# Patient Record
Sex: Male | Born: 1960 | Hispanic: No | Marital: Married | Smoking: Never smoker
Health system: Southern US, Community
[De-identification: ages and names within clinical notes are randomized; demographics above are authoritative.]

## PROBLEM LIST (undated history)

## (undated) DIAGNOSIS — I10 Essential (primary) hypertension: Secondary | ICD-10-CM

---

## 2003-12-21 ENCOUNTER — Inpatient Hospital Stay (HOSPITAL_COMMUNITY): Admission: EM | Admit: 2003-12-21 | Discharge: 2003-12-22 | Payer: Self-pay | Admitting: *Deleted

## 2003-12-21 ENCOUNTER — Ambulatory Visit: Payer: Self-pay | Admitting: Gastroenterology

## 2003-12-21 ENCOUNTER — Ambulatory Visit: Payer: Self-pay | Admitting: Family Medicine

## 2008-11-29 ENCOUNTER — Encounter (INDEPENDENT_AMBULATORY_CARE_PROVIDER_SITE_OTHER): Payer: Self-pay | Admitting: *Deleted

## 2009-08-21 ENCOUNTER — Telehealth: Payer: Self-pay | Admitting: Gastroenterology

## 2010-03-05 NOTE — Progress Notes (Signed)
Summary: Schedule Colonoscopy  Phone Note Outgoing Call Call back at [facility] Phone [phone]   Call placed by: Harlow Mares CMA Duncan Dull),  August 21, 2009 9:36 AM Call placed to: Patient Summary of Call: Left message on patients machine to call back. patient is due for a colonoscopy Initial call taken by: Harlow Mares CMA Duncan Dull),  August 21, 2009 9:36 AM  Follow-up for Phone Call        Left a message on the patient machine to call back and schedule a previsit and procedure with our office. A letter will be mailed to the patient.   Follow-up by: Harlow Mares CMA Duncan Dull),  August 28, 2009 12:11 PM

## 2011-06-18 ENCOUNTER — Encounter: Payer: Self-pay | Admitting: Gastroenterology

## 2019-07-10 ENCOUNTER — Emergency Department (HOSPITAL_COMMUNITY): Payer: 59

## 2019-07-10 ENCOUNTER — Inpatient Hospital Stay (HOSPITAL_COMMUNITY): Payer: 59

## 2019-07-10 ENCOUNTER — Inpatient Hospital Stay (HOSPITAL_COMMUNITY)
Admission: EM | Admit: 2019-07-10 | Discharge: 2019-07-21 | DRG: 064 | Disposition: A | Discharge status: Skilled Nursing Facility | Payer: 59 | Attending: Internal Medicine | Admitting: Internal Medicine

## 2019-07-10 ENCOUNTER — Encounter (HOSPITAL_COMMUNITY): Payer: Self-pay | Admitting: Primary Care

## 2019-07-10 DIAGNOSIS — Z20822 Contact with and (suspected) exposure to covid-19: Secondary | ICD-10-CM | POA: Diagnosis present

## 2019-07-10 DIAGNOSIS — G935 Compression of brain: Secondary | ICD-10-CM | POA: Diagnosis present

## 2019-07-10 DIAGNOSIS — R29810 Facial weakness: Secondary | ICD-10-CM | POA: Diagnosis present

## 2019-07-10 DIAGNOSIS — R000 Tachycardia, unspecified: Secondary | ICD-10-CM | POA: Diagnosis not present

## 2019-07-10 DIAGNOSIS — G8104 Flaccid hemiplegia affecting left nondominant side: Secondary | ICD-10-CM | POA: Diagnosis present

## 2019-07-10 DIAGNOSIS — E870 Hyperosmolality and hypernatremia: Secondary | ICD-10-CM

## 2019-07-10 DIAGNOSIS — H547 Unspecified visual loss: Secondary | ICD-10-CM | POA: Diagnosis present

## 2019-07-10 DIAGNOSIS — I10 Essential (primary) hypertension: Secondary | ICD-10-CM

## 2019-07-10 DIAGNOSIS — N179 Acute kidney failure, unspecified: Secondary | ICD-10-CM

## 2019-07-10 DIAGNOSIS — R1311 Dysphagia, oral phase: Secondary | ICD-10-CM | POA: Diagnosis present

## 2019-07-10 DIAGNOSIS — I63512 Cerebral infarction due to unspecified occlusion or stenosis of left middle cerebral artery: Secondary | ICD-10-CM | POA: Diagnosis present

## 2019-07-10 DIAGNOSIS — I161 Hypertensive emergency: Secondary | ICD-10-CM | POA: Diagnosis present

## 2019-07-10 DIAGNOSIS — Z6827 Body mass index (BMI) 27.0-27.9, adult: Secondary | ICD-10-CM

## 2019-07-10 DIAGNOSIS — E876 Hypokalemia: Secondary | ICD-10-CM

## 2019-07-10 DIAGNOSIS — G9340 Encephalopathy, unspecified: Secondary | ICD-10-CM | POA: Diagnosis present

## 2019-07-10 DIAGNOSIS — R1312 Dysphagia, oropharyngeal phase: Secondary | ICD-10-CM | POA: Diagnosis not present

## 2019-07-10 DIAGNOSIS — H53462 Homonymous bilateral field defects, left side: Secondary | ICD-10-CM | POA: Diagnosis present

## 2019-07-10 DIAGNOSIS — G936 Cerebral edema: Secondary | ICD-10-CM | POA: Diagnosis present

## 2019-07-10 DIAGNOSIS — R0682 Tachypnea, not elsewhere classified: Secondary | ICD-10-CM | POA: Diagnosis not present

## 2019-07-10 DIAGNOSIS — N390 Urinary tract infection, site not specified: Secondary | ICD-10-CM

## 2019-07-10 DIAGNOSIS — R32 Unspecified urinary incontinence: Secondary | ICD-10-CM | POA: Diagnosis present

## 2019-07-10 DIAGNOSIS — B961 Klebsiella pneumoniae [K. pneumoniae] as the cause of diseases classified elsewhere: Secondary | ICD-10-CM | POA: Diagnosis present

## 2019-07-10 DIAGNOSIS — R414 Neurologic neglect syndrome: Secondary | ICD-10-CM | POA: Diagnosis present

## 2019-07-10 DIAGNOSIS — E663 Overweight: Secondary | ICD-10-CM | POA: Diagnosis present

## 2019-07-10 DIAGNOSIS — R29719 NIHSS score 19: Secondary | ICD-10-CM | POA: Diagnosis present

## 2019-07-10 DIAGNOSIS — I619 Nontraumatic intracerebral hemorrhage, unspecified: Secondary | ICD-10-CM | POA: Diagnosis present

## 2019-07-10 DIAGNOSIS — E785 Hyperlipidemia, unspecified: Secondary | ICD-10-CM | POA: Diagnosis present

## 2019-07-10 DIAGNOSIS — I6389 Other cerebral infarction: Secondary | ICD-10-CM | POA: Diagnosis not present

## 2019-07-10 DIAGNOSIS — R739 Hyperglycemia, unspecified: Secondary | ICD-10-CM

## 2019-07-10 DIAGNOSIS — I63312 Cerebral infarction due to thrombosis of left middle cerebral artery: Secondary | ICD-10-CM | POA: Diagnosis not present

## 2019-07-10 DIAGNOSIS — I610 Nontraumatic intracerebral hemorrhage in hemisphere, subcortical: Principal | ICD-10-CM | POA: Diagnosis present

## 2019-07-10 DIAGNOSIS — R509 Fever, unspecified: Secondary | ICD-10-CM

## 2019-07-10 DIAGNOSIS — Z9981 Dependence on supplemental oxygen: Secondary | ICD-10-CM

## 2019-07-10 HISTORY — DX: Essential (primary) hypertension: I10

## 2019-07-10 LAB — PROTIME-INR
INR: 1 (ref 0.8–1.2)
Prothrombin Time: 12.8 seconds (ref 11.4–15.2)

## 2019-07-10 LAB — RAPID URINE DRUG SCREEN, HOSP PERFORMED
Amphetamines: NOT DETECTED
Barbiturates: NOT DETECTED
Benzodiazepines: NOT DETECTED
Cocaine: NOT DETECTED
Opiates: NOT DETECTED
Tetrahydrocannabinol: NOT DETECTED

## 2019-07-10 LAB — I-STAT CHEM 8, ED
BUN: 21 mg/dL — ABNORMAL HIGH (ref 6–20)
Calcium, Ion: 1.12 mmol/L — ABNORMAL LOW (ref 1.15–1.40)
Chloride: 102 mmol/L (ref 98–111)
Creatinine, Ser: 1.2 mg/dL (ref 0.61–1.24)
Glucose, Bld: 146 mg/dL — ABNORMAL HIGH (ref 70–99)
HCT: 47 % (ref 39.0–52.0)
Hemoglobin: 16 g/dL (ref 13.0–17.0)
Potassium: 3.8 mmol/L (ref 3.5–5.1)
Sodium: 140 mmol/L (ref 135–145)
TCO2: 22 mmol/L (ref 22–32)

## 2019-07-10 LAB — DIFFERENTIAL
Abs Immature Granulocytes: 0.04 10*3/uL (ref 0.00–0.07)
Basophils Absolute: 0.1 10*3/uL (ref 0.0–0.1)
Basophils Relative: 1 %
Eosinophils Absolute: 0.3 10*3/uL (ref 0.0–0.5)
Eosinophils Relative: 3 %
Immature Granulocytes: 0 %
Lymphocytes Relative: 31 %
Lymphs Abs: 3.4 10*3/uL (ref 0.7–4.0)
Monocytes Absolute: 0.6 10*3/uL (ref 0.1–1.0)
Monocytes Relative: 6 %
Neutro Abs: 6.5 10*3/uL (ref 1.7–7.7)
Neutrophils Relative %: 59 %

## 2019-07-10 LAB — CBC
HCT: 44.9 % (ref 39.0–52.0)
Hemoglobin: 14.7 g/dL (ref 13.0–17.0)
MCH: 20.8 pg — ABNORMAL LOW (ref 26.0–34.0)
MCHC: 32.7 g/dL (ref 30.0–36.0)
MCV: 63.5 fL — ABNORMAL LOW (ref 80.0–100.0)
Platelets: 315 10*3/uL (ref 150–400)
RBC: 7.07 MIL/uL — ABNORMAL HIGH (ref 4.22–5.81)
RDW: 18.9 % — ABNORMAL HIGH (ref 11.5–15.5)
WBC: 11 10*3/uL — ABNORMAL HIGH (ref 4.0–10.5)
nRBC: 0 % (ref 0.0–0.2)

## 2019-07-10 LAB — SODIUM
Sodium: 142 mmol/L (ref 135–145)
Sodium: 141 mmol/L (ref 135–145)
Sodium: 141 mmol/L (ref 135–145)

## 2019-07-10 LAB — COMPREHENSIVE METABOLIC PANEL
ALT: 44 U/L (ref 0–44)
AST: 64 U/L — ABNORMAL HIGH (ref 15–41)
Albumin: 3.8 g/dL (ref 3.5–5.0)
Alkaline Phosphatase: 66 U/L (ref 38–126)
Anion gap: 12 (ref 5–15)
BUN: 19 mg/dL (ref 6–20)
CO2: 21 mmol/L — ABNORMAL LOW (ref 22–32)
Calcium: 8.8 mg/dL — ABNORMAL LOW (ref 8.9–10.3)
Chloride: 104 mmol/L (ref 98–111)
Creatinine, Ser: 1.34 mg/dL — ABNORMAL HIGH (ref 0.61–1.24)
GFR calc Af Amer: >60 mL/min (ref >60)
GFR calc non Af Amer: 59 mL/min — ABNORMAL LOW (ref >60)
Glucose, Bld: 150 mg/dL — ABNORMAL HIGH (ref 70–99)
Potassium: 3.8 mmol/L (ref 3.5–5.1)
Sodium: 137 mmol/L (ref 135–145)
Total Bilirubin: 0.2 mg/dL — ABNORMAL LOW (ref 0.3–1.2)
Total Protein: 7.3 g/dL (ref 6.5–8.1)

## 2019-07-10 LAB — SARS CORONAVIRUS 2 BY RT PCR (HOSPITAL ORDER, PERFORMED IN ~~LOC~~ HOSPITAL LAB): SARS Coronavirus 2: NEGATIVE

## 2019-07-10 LAB — LIPID PANEL
Cholesterol: 198 mg/dL (ref 0–200)
HDL: 56 mg/dL (ref >40)
LDL Cholesterol: 117 mg/dL — ABNORMAL HIGH (ref 0–99)
Total CHOL/HDL Ratio: 3.5 RATIO
Triglycerides: 124 mg/dL (ref <150)
VLDL: 25 mg/dL (ref 0–40)

## 2019-07-10 LAB — URINALYSIS, ROUTINE W REFLEX MICROSCOPIC
Bacteria, UA: NONE SEEN
Bilirubin Urine: NEGATIVE
Glucose, UA: NEGATIVE mg/dL
Ketones, ur: NEGATIVE mg/dL
Leukocytes,Ua: NEGATIVE
Nitrite: NEGATIVE
Protein, ur: 100 mg/dL — AB
Specific Gravity, Urine: 1.013 (ref 1.005–1.030)
pH: 5 (ref 5.0–8.0)

## 2019-07-10 LAB — APTT: aPTT: 26 seconds (ref 24–36)

## 2019-07-10 LAB — CBG MONITORING, ED: Glucose-Capillary: 153 mg/dL — ABNORMAL HIGH (ref 70–99)

## 2019-07-10 LAB — ETHANOL: Alcohol, Ethyl (B): <10 mg/dL (ref <10)

## 2019-07-10 LAB — MRSA PCR SCREENING: MRSA by PCR: NEGATIVE

## 2019-07-10 LAB — HIV ANTIBODY (ROUTINE TESTING W REFLEX): HIV Screen 4th Generation wRfx: NONREACTIVE

## 2019-07-10 MED ORDER — CLEVIDIPINE BUTYRATE 0.5 MG/ML IV EMUL
0.0000 mg/h | INTRAVENOUS | Status: DC
  Administered 2019-07-10: 28 mg/h via INTRAVENOUS
  Administered 2019-07-10: 24 mg/h via INTRAVENOUS
  Administered 2019-07-11: 11 mg/h via INTRAVENOUS
  Administered 2019-07-11 (×2): 28 mg/h via INTRAVENOUS
  Administered 2019-07-11: 13 mg/h via INTRAVENOUS
  Administered 2019-07-11 (×2): 28 mg/h via INTRAVENOUS
  Administered 2019-07-11: 1 mg/h via INTRAVENOUS
  Administered 2019-07-12: 22 mg/h via INTRAVENOUS
  Administered 2019-07-12: 24 mg/h via INTRAVENOUS
  Administered 2019-07-12: 20 mg/h via INTRAVENOUS
  Administered 2019-07-12: 29 mg/h via INTRAVENOUS
  Administered 2019-07-12: 22 mg/h via INTRAVENOUS
  Administered 2019-07-12: 20 mg/h via INTRAVENOUS
  Administered 2019-07-12: 25 mg/h via INTRAVENOUS
  Administered 2019-07-12: 22 mg/h via INTRAVENOUS
  Administered 2019-07-12: 15 mg/h via INTRAVENOUS
  Administered 2019-07-12: 12 mg/h via INTRAVENOUS
  Administered 2019-07-12: 24 mg/h via INTRAVENOUS
  Administered 2019-07-12: 23 mg/h via INTRAVENOUS
  Administered 2019-07-13: 30 mg/h via INTRAVENOUS
  Administered 2019-07-13: 22 mg/h via INTRAVENOUS
  Administered 2019-07-13: 20 mg/h via INTRAVENOUS
  Administered 2019-07-13: 10 mg/h via INTRAVENOUS
  Administered 2019-07-13: 24 mg/h via INTRAVENOUS
  Administered 2019-07-13: 16 mg/h via INTRAVENOUS
  Filled 2019-07-10: qty 100
  Filled 2019-07-10 (×4): qty 50
  Filled 2019-07-10: qty 100
  Filled 2019-07-10 (×6): qty 50
  Filled 2019-07-10: qty 100
  Filled 2019-07-10 (×3): qty 50
  Filled 2019-07-10: qty 100
  Filled 2019-07-10 (×3): qty 50
  Filled 2019-07-10: qty 100
  Filled 2019-07-10: qty 50

## 2019-07-10 MED ORDER — SODIUM CHLORIDE 3 % IV SOLN
INTRAVENOUS | Status: DC
  Administered 2019-07-10 – 2019-07-11 (×5): 50 mL/h via INTRAVENOUS
  Administered 2019-07-12: 75 mL/h via INTRAVENOUS
  Filled 2019-07-10 (×6): qty 500

## 2019-07-10 MED ORDER — CLEVIDIPINE BUTYRATE 0.5 MG/ML IV EMUL
0.0000 mg/h | INTRAVENOUS | Status: DC
  Administered 2019-07-10: 1 mg/h via INTRAVENOUS
  Administered 2019-07-10 (×2): 30 mg/h via INTRAVENOUS
  Filled 2019-07-10: qty 100
  Filled 2019-07-10 (×3): qty 50

## 2019-07-10 MED ORDER — IOHEXOL 350 MG/ML SOLN
50.0000 mL | Freq: Once | INTRAVENOUS | Status: AC | PRN
  Administered 2019-07-10: 50 mL via INTRAVENOUS

## 2019-07-10 MED ORDER — ACETAMINOPHEN 325 MG PO TABS
650.0000 mg | ORAL_TABLET | ORAL | Status: DC | PRN
  Administered 2019-07-12 – 2019-07-20 (×12): 650 mg via ORAL
  Filled 2019-07-10 (×12): qty 2

## 2019-07-10 MED ORDER — PANTOPRAZOLE SODIUM 40 MG IV SOLR
40.0000 mg | Freq: Every day | INTRAVENOUS | Status: DC
  Administered 2019-07-10 – 2019-07-15 (×6): 40 mg via INTRAVENOUS
  Filled 2019-07-10 (×6): qty 40

## 2019-07-10 MED ORDER — SODIUM CHLORIDE 3 % IV BOLUS
250.0000 mL | Freq: Once | INTRAVENOUS | Status: AC
  Administered 2019-07-10: 250 mL via INTRAVENOUS
  Filled 2019-07-10: qty 250

## 2019-07-10 MED ORDER — SENNOSIDES-DOCUSATE SODIUM 8.6-50 MG PO TABS
1.0000 | ORAL_TABLET | Freq: Two times a day (BID) | ORAL | Status: DC
  Administered 2019-07-11 – 2019-07-21 (×18): 1 via ORAL
  Filled 2019-07-10 (×19): qty 1

## 2019-07-10 MED ORDER — STROKE: EARLY STAGES OF RECOVERY BOOK: Freq: Once | Status: AC

## 2019-07-10 MED ORDER — ACETAMINOPHEN 650 MG RE SUPP
650.0000 mg | RECTAL | Status: DC | PRN
  Administered 2019-07-14: 650 mg via RECTAL
  Filled 2019-07-10: qty 1

## 2019-07-10 MED ORDER — ACETAMINOPHEN 160 MG/5ML PO SOLN: 650.0000 mg | ORAL | Status: DC | PRN

## 2019-07-10 MED ORDER — SODIUM CHLORIDE 0.9% FLUSH: 3.0000 mL | Freq: Once | INTRAVENOUS | Status: DC

## 2019-07-10 NOTE — ED Notes (Signed)
3% saline started

## 2019-07-10 NOTE — Progress Notes (Signed)
Rpt CT / CTA reviewed, ICH grossly stable in size, no obvious underlying vascular malformation. Using volumetric analysis (instead of ellipsoid volume estimation), ICH measures 27cm^3, under criteria for minimally invasive evacuation. Will follow the patient clinically, if stable then likely won't require clot evacuation.
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
## 2019-07-11 ENCOUNTER — Inpatient Hospital Stay (HOSPITAL_COMMUNITY): Payer: 59

## 2019-07-11 DIAGNOSIS — I6389 Other cerebral infarction: Secondary | ICD-10-CM

## 2019-07-11 LAB — ECHOCARDIOGRAM COMPLETE
Height: 64 in
Weight: 2518.54 oz

## 2019-07-11 LAB — HEMOGLOBIN A1C
Hgb A1c MFr Bld: 5.1 % (ref 4.8–5.6)
Mean Plasma Glucose: 100 mg/dL

## 2019-07-11 LAB — SODIUM
Sodium: 143 mmol/L (ref 135–145)
Sodium: 146 mmol/L — ABNORMAL HIGH (ref 135–145)
Sodium: 146 mmol/L — ABNORMAL HIGH (ref 135–145)
Sodium: 141 mmol/L (ref 135–145)

## 2019-07-11 MED ORDER — CHLORHEXIDINE GLUCONATE CLOTH 2 % EX PADS
6.0000 | MEDICATED_PAD | Freq: Every day | CUTANEOUS | Status: DC
  Administered 2019-07-11 – 2019-07-21 (×9): 6 via TOPICAL

## 2019-07-11 MED ORDER — AMLODIPINE BESYLATE 5 MG PO TABS
5.0000 mg | ORAL_TABLET | Freq: Every day | ORAL | Status: DC
  Administered 2019-07-11 – 2019-07-12 (×2): 5 mg via ORAL
  Filled 2019-07-11 (×3): qty 1

## 2019-07-11 MED ORDER — LABETALOL HCL 5 MG/ML IV SOLN
20.0000 mg | INTRAVENOUS | Status: DC | PRN
  Administered 2019-07-11 (×3): 20 mg via INTRAVENOUS
  Filled 2019-07-11 (×3): qty 4

## 2019-07-11 NOTE — Evaluation (Signed)
Clinical/Bedside Swallow Evaluation Patient Details  Name: Jonathan Flowers MRN: [id number] Date of Birth: October 29, 1960  Today's Date: 07/11/2019 Time: SLP Start Time (ACUTE ONLY): 2297 SLP Stop Time (ACUTE ONLY): 0845 SLP Time Calculation (min) (ACUTE ONLY): 10 min  Past Medical History:  Past Medical History:  Diagnosis Date  . HTN (hypertension)    Past Surgical History: History reviewed. No pertinent surgical history. HPI:   59 y.o. man w/ acute onset headache with left sided weakness. CTH  shows right basal ganglia ICH measuring 5.4x2.9x4.0cm with 5.29mm of midline shift, volume estimated at 32-38cc.   Assessment / Plan / Recommendation Clinical Impression  Pt demonstrates cognition impacted by lethargy with briefly focused attention to motiviting tasks (drinking water, expressing need to use the bathroom). Otherwise, it is difficult to elicit eye opening and verbal responses, which are inconsistent. After face washing, heavy verbal encouragement and physical assist to open eyes, pt able to move eyes from hard right deviation to slight midline when tracking water cup. Pt si only slightly dysarthric due to left CN VII weakness. Language appears in tact (pt fluently responds in English in full sentences, but may need interpreter for more in-depth sessions), though initiation is minimal. Pt required frequent reorienting and eventually stopped verbally responding. Will continue efforts to address cognition as arousal improves. Recommend CIR at d/c.  SLP Visit Diagnosis: Cognitive communication deficit (R41.841)    Aspiration Risk  Moderate aspiration risk    Diet Recommendation NPO except meds;Ice chips PRN after oral care   Liquid Administration via: Spoon;Cup Medication Administration: Whole meds with puree Supervision: Staff to assist with self feeding;Full supervision/cueing for compensatory strategies Compensations: Slow rate;Small sips/bites Postural Changes: Seated upright at 90 degrees     Other  Recommendations Oral Care Recommendations: Oral care QID Other Recommendations: Have oral suction available   Follow up Recommendations Inpatient Rehab      Frequency and Duration min 2x/week  2 weeks       Prognosis Prognosis for Safe Diet Advancement: Good      Swallow Study   General HPI:  59 y.o. man w/ acute onset headache with left sided weakness. CTH  shows right basal ganglia ICH measuring 5.4x2.9x4.0cm with 5.42mm of midline shift, volume estimated at 32-38cc. Type of Study: Bedside Swallow Evaluation Previous Swallow Assessment: none Diet Prior to this Study: NPO Temperature Spikes Noted: No Respiratory Status: Room air History of Recent Intubation: No Behavior/Cognition: Distractible;Lethargic/Drowsy;Requires cueing Oral Care Completed by SLP: No Oral Cavity - Dentition: Adequate natural dentition Vision: Impaired for self-feeding Self-Feeding Abilities: Needs assist Patient Positioning: Upright in bed Baseline Vocal Quality: Normal Volitional Cough: Weak Volitional Swallow: Able to elicit    Oral/Motor/Sensory Function Overall Oral Motor/Sensory Function: Moderate impairment Facial ROM: Reduced left;Suspected CN VII (facial) dysfunction Facial Symmetry: Abnormal symmetry left;Suspected CN VII (facial) dysfunction Facial Strength: Reduced left;Suspected CN VII (facial) dysfunction Facial Sensation: Reduced left;Suspected CN V (Trigeminal) dysfunction Lingual ROM: Within Functional Limits Lingual Symmetry: Within Functional Limits Lingual Strength: Within Functional Limits   Ice Chips Ice chips: Within functional limits   Thin Liquid Thin Liquid: Impaired Presentation: Cup;Spoon;Straw Oral Phase Functional Implications: Right anterior spillage Pharyngeal  Phase Impairments: Cough - Immediate    Nectar Thick Nectar Thick Liquid: Impaired Presentation: Straw Oral phase functional implications: Right anterior spillage Pharyngeal Phase Impairments:  Cough - Immediate   Honey Thick Honey Thick Liquid: Not tested   Puree Puree: Impaired Presentation: Spoon Oral Phase Functional Implications: Prolonged oral transit;Right anterior spillage  Solid     Solid: Not tested     Harlon Ditty, MA CCC-SLP  Acute Rehabilitation Services Pager [phone] Office [phone]  [name] 07/11/2019,10:25 AM

## 2019-07-11 NOTE — Progress Notes (Signed)
Picked up pt at 1430, BP elevated, PRN med given, will continue to monitor.

## 2019-07-11 NOTE — Progress Notes (Signed)
OT Cancellation Note  Patient Details Name: Finlee Milo MRN: [id number] DOB: 16-Sep-1960   Cancelled Treatment:    Reason Eval/Treat Not Completed: Medical issues which prohibited therapy. Pt remains on strict bed rest per Neuro team. Will re-check on patient tomorrow.  Ignacia Palma, OTR/L Acute Rehab Services Pager [phone] Office [phone]     Evette Georges 07/11/2019, 10:55 AM

## 2019-07-11 NOTE — Progress Notes (Signed)
STROKE TEAM PROGRESS NOTE   INTERVAL HISTORY I personally reviewed history of presenting illness with the patient, in detail as well as electronic medical records and imaging films in PACS.  He states he has no known history of hypertension and does not take any medications and has not seen a primary care physician for years.  He presented with significantly elevated blood pressure and right basal ganglia hemorrhage and MRI also shows a small left periventricular white matter ischemic infarct.  Blood pressure is controlled on Cardene drip.  Neurologically stable.  Vitals:   07/11/19 0645 07/11/19 0700 07/11/19 0715 07/11/19 0800  BP: 138/74 133/66 129/73 131/68  Pulse: (!) 110 (!) 105 99 (!) 101  Resp: (!) 22 (!) 24 (!) 21 18  Temp:    98.1 F (36.7 C)  TempSrc:    Oral  SpO2: 94% 95% 93% 95%  Weight:      Height:        CBC:  Recent Labs  Lab 07/10/19 1006 07/10/19 1008  WBC 11.0*  --   NEUTROABS 6.5  --   HGB 14.7 16.0  HCT 44.9 47.0  MCV 63.5*  --   PLT 315  --     Basic Metabolic Panel:  Recent Labs  Lab 07/10/19 1006 07/10/19 1006 07/10/19 1008 07/10/19 1256 07/10/19 2207 07/11/19 0515  NA 137   < > 140   < > 141 143  K 3.8  --  3.8  --   --   --   CL 104  --  102  --   --   --   CO2 21*  --   --   --   --   --   GLUCOSE 150*  --  146*  --   --   --   BUN 19  --  21*  --   --   --   CREATININE 1.34*  --  1.20  --   --   --   CALCIUM 8.8*  --   --   --   --   --    < > = values in this interval not displayed.   Lipid Panel:     Component Value Date/Time   CHOL 198 07/10/2019 1256   TRIG 124 07/10/2019 1256   HDL 56 07/10/2019 1256   CHOLHDL 3.5 07/10/2019 1256   VLDL 25 07/10/2019 1256   LDLCALC 117 (H) 07/10/2019 1256   HgbA1c:  Lab Results  Component Value Date   HGBA1C 5.1 07/10/2019   Urine Drug Screen:     Component Value Date/Time   LABOPIA NONE DETECTED 07/10/2019 1800   COCAINSCRNUR NONE DETECTED 07/10/2019 1800   LABBENZ NONE DETECTED  07/10/2019 1800   AMPHETMU NONE DETECTED 07/10/2019 1800   THCU NONE DETECTED 07/10/2019 1800   LABBARB NONE DETECTED 07/10/2019 1800    Alcohol Level     Component Value Date/Time   ETH <10 07/10/2019 1256    IMAGING past 24 hours MR BRAIN WO CONTRAST  Result Date: 07/11/2019 CLINICAL DATA:  Follow-up examination for intracranial hemorrhage. History of hypertension. EXAM: MRI HEAD WITHOUT CONTRAST TECHNIQUE: Multiplanar, multiecho pulse sequences of the brain and surrounding structures were obtained without intravenous contrast. COMPARISON:  Prior CTs from 07/10/2019. FINDINGS: Brain: Mild age-related cerebral atrophy. Patchy T2/FLAIR hyperintensity within the periventricular and deep white matter both cerebral hemispheres most consistent with chronic small vessel ischemic disease, mildly advanced for age. Probable superimposed small remote cortical/subcortical infarct noted at the  left parietal lobe (series 13, image 14). Large intraparenchymal hemorrhage centered at the right basal ganglia again seen, not significantly changed in size measuring 5.7 x 3.2 x 4.4 cm. Surrounding regional mass effect and vasogenic edema also relatively similar. Mass effect on the adjacent right lateral ventricle which is partially effaced. Associated right-to-left shift measures up to 6 mm, stable. No hydrocephalus or trapping. Basilar cisterns remain patent. No appreciable underlying lesion or other abnormality seen on this noncontrast examination. No other acute intracranial hemorrhage. Multiple scattered chronic micro hemorrhages seen scattered throughout the bilateral cerebral hemispheres, nonspecific, but favored to be hypertensive in nature given patient age. Focal encephalomalacia with hemosiderin staining at the anterior-mid left temporal lobe likely reflects changes related to a prior hemorrhage (series 11, image 25). Additional 18 mm linear acute ischemic nonhemorrhagic infarct present at the left periatrial  white matter (series 5, image 70). No associated mass effect. No other diffusion abnormality to suggest acute or subacute ischemia. Gray-white matter differentiation otherwise maintained. No mass lesion or extra-axial fluid collection. Pituitary gland suprasellar region within normal limits. Vascular: Major intracranial vascular flow voids are maintained. Skull and upper cervical spine: Craniocervical junction within normal limits. Bone marrow signal intensity normal. No scalp soft tissue abnormality. Sinuses/Orbits: Globes and orbital soft tissues within normal limits. Mild scattered mucosal thickening noted throughout the paranasal sinuses. Mastoid air cells are clear. Other: None. IMPRESSION: 1. No significant interval change in intraparenchymal hemorrhage centered at the right basal ganglia with similar regional mass effect and up to 6 mm of right-to-left shift. No hydrocephalus or ventricular trapping. No appreciable underlying lesion or other abnormality seen on this noncontrast examination. 2. 18 mm acute ischemic nonhemorrhagic left periatrial white matter infarct. 3. Multiple scattered chronic micro hemorrhages involving the bilateral cerebral hemispheres, with additional evidence for prior/chronic intraparenchymal hemorrhage at the left temporal lobe as above. Findings favored to be hypertensive in nature given history and patient age. Cerebral amyloid angiopathy would be the primary differential consideration. 4. Underlying mildly advanced cerebral atrophy with chronic small vessel ischemic disease. Electronically Signed   By: Jeannine Boga M.D.   On: 07/11/2019 02:29   DG Pelvis Portable  Result Date: 07/10/2019 CLINICAL DATA:  MRI clearance EXAM: PORTABLE PELVIS 1-2 VIEWS COMPARISON:  Portable exam at 1412 hrs without priors for comparison FINDINGS: No metallic foreign bodies project over pelvis. Osseous structures unremarkable. IMPRESSION: No metallic foreign bodies identified. Electronically  Signed   By: Lavonia Dana M.D.   On: 07/10/2019 14:21   Chest Port 1 View  Result Date: 07/10/2019 CLINICAL DATA:  ICH (intracerebral hemorrhage) EXAM: PORTABLE CHEST - 1 VIEW COMPARISON:  none FINDINGS: Mild elevation of the right diaphragmatic leaflet. Relatively low lung volumes with crowding of bronchovascular structures. No focal infiltrate. Can not exclude mild interstitial edema. Mild cardiomegaly.  Aortic Atherosclerosis (ICD10-170.0). No effusion.  No pneumothorax. Visualized bones unremarkable. IMPRESSION: Low lung volumes.  Mild cardiomegaly. Electronically Signed   By: Lucrezia Europe M.D.   On: 07/10/2019 11:08   DG Abd Portable 1 View  Result Date: 07/10/2019 CLINICAL DATA:  MRI clearance question metallic foreign body EXAM: PORTABLE ABDOMEN - 1 VIEW COMPARISON:  None FINDINGS: EKG lead projects over LEFT abdomen. No additional metallic foreign bodies. 4 mm calculus projects over LEFT kidney. Bowel gas pattern normal. Bones demineralized. IMPRESSION: 4 mm LEFT renal calculus. LEFT abdominal EKG lead without additional metallic foreign bodies. Electronically Signed   By: Lavonia Dana M.D.   On: 07/10/2019 14:20   CT  HEAD CODE STROKE WO CONTRAST  Result Date: 07/10/2019 CLINICAL DATA:  Code stroke. Sudden onset of headache, nausea and vomiting. Hypertension. EXAM: CT HEAD WITHOUT CONTRAST TECHNIQUE: Contiguous axial images were obtained from the base of the skull through the vertex without intravenous contrast. COMPARISON:  None. FINDINGS: Brain: Acute intraparenchymal hemorrhage in the right basal ganglia and radiating white matter tracts measuring 5.4 x 4.5 x 3.0 (volume = 38) cm. No intraventricular penetration at this time. Mass effect with flattening of the right lateral ventricle and right-to-left shift of 6 mm. Chronic small-vessel ischemic changes elsewhere affect the cerebral hemispheric white matter. No sign of acute cortical infarction. No mass lesion, hydrocephalus or extra-axial  collection. Vascular: There is atherosclerotic calcification of the major vessels at the base of the brain. Skull: Negative Sinuses/Orbits: Clear/normal Other: None IMPRESSION: 1. Acute intraparenchymal hemorrhage affecting the right basal ganglia and regional white matter tracks. Volume 38 cc. Mass effect with right-to-left shift of 6 mm. These results were communicated to Dr. Otelia Limes at 10:19 amon 6/6/2021by text page via the [facility] messaging system. Electronically Signed   By: Paulina Fusi M.D.   On: 07/10/2019 10:20   CT ANGIO HEAD CODE STROKE  Result Date: 07/10/2019 CLINICAL DATA:  Parenchymal hemorrhage, follow-up. EXAM: CT ANGIOGRAPHY HEAD TECHNIQUE: Multidetector CT imaging of the head was performed using the standard protocol during bolus administration of intravenous contrast. Multiplanar CT image reconstructions and MIPs were obtained to evaluate the vascular anatomy. CONTRAST:  69mL OMNIPAQUE IOHEXOL 350 MG/ML SOLN COMPARISON:  Noncontrast head CT performed earlier the same day. FINDINGS: CT HEAD Brain: Again demonstrated is an acute parenchymal hemorrhage centered within the right basal ganglia and radiating white matter tracks. The hemorrhage has not significant changed in size, again measuring 5.7 x 3.1 x 4.5 cm (AP x TV x CC) (remeasured on prior). Surrounding edema appears subtly increased. As before, there is significant effacement of the right lateral ventricle. Unchanged 6 mm leftward midline shift. No intraventricular extension of hemorrhage at this time. No evidence of acute demarcated cortical infarction. Elsewhere, there are stable chronic small vessel ischemic changes. No evidence of intracranial mass, hydrocephalus or extra-axial fluid collection. Vascular: Reported below. Skull: Normal. Negative for fracture or focal lesion. Sinuses: Mild ethmoid and right maxillary sinus mucosal thickening. Orbits: No acute abnormality. CTA HEAD Anterior circulation: The intracranial internal carotid  arteries are patent without significant stenosis. Mild scattered calcified plaque within these vessels. The M1 middle cerebral arteries are patent without significant stenosis. No M2 proximal branch occlusion or high-grade proximal stenosis is identified. The anterior cerebral arteries are patent bilaterally. There is a moderate/severe focal stenosis within the distal A2 left anterior cerebral artery. Moderate focal stenosis within the distal A2 right anterior cerebral artery. Additional sclerotic irregularity of the distal anterior cerebral arteries bilaterally. No appreciable aneurysm or arteriovenous malformation. Posterior circulation: The non dominant intracranial right vertebral artery is developmentally diminutive but patent and appears to terminate as the right PICA. The dominant intracranial left vertebral artery is patent without significant stenosis. The posterior cerebral arteries are patent bilaterally. The posterior cerebral arteries are patent bilaterally without high-grade proximal stenosis. Posterior communicating arteries are hypoplastic or absent bilaterally. Venous sinuses: The venous sinuses are poorly assessed due to contrast timing Anatomic variants: As described IMPRESSION: CT head: 1. Unchanged size of a 5.7 cm acute parenchymal hemorrhage centered within the right basal ganglia and radiating white matter tracks. Surrounding edema has slightly increased. Mass effect has not significantly changed. As before, there is  significant partial effacement of the right lateral ventricle with 6 mm leftward midline shift. 2. No evidence of intraventricular extension of hemorrhage at this time. No hydrocephalus. CTA head: 1. No intracranial large vessel occlusion. 2. 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 3. Moderate focal stenosis within the distal A2 right anterior cerebral artery. 4. No appreciable intracranial aneurysm or AVM. Electronically Signed   By:  Jackey Loge DO   On: 07/10/2019 18:58    PHYSICAL EXAM Pleasant middle-aged Asian male not in distress. . Afebrile. Head is nontraumatic. Neck is supple without bruit.    Cardiac exam no murmur or gallop. Lungs are clear to auscultation. Distal pulses are well felt. Neurological Exam :  He is awake alert oriented to time and person.  Diminished attention, registration and recall.  Right gaze preference but able to look to the left past midline.  Speech is nonfluent but does speak few words and short sentences.  Follows simple midline and one-step commands.  Left lower facial weakness.  Dense left hemiplegia with only trace withdrawal in the lower extremity to pain and none in the upper extremity.  Tone is reduced on the left with flaccidity.  Sensation is preserved on the right and reduced on the left.  Right plantar downgoing left upgoing.  Gait not tested.  ASSESSMENT/PLAN [name] is a 59 y.o. male with history of HTN presenting with R gaze deviation and L sided flacidity. In ED developed less responsive state, HTN emergency and urine incontinence.   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   CT head unchanged R basal ganglia ICH w/ 9mm shift.   CTA head no LVO. Distal L A2 moderate/severe stenosis. Distal R A2 moderate stenosis.   MRI  Stable R basal ganglia ICH. L periatrial white matter infarct. Multiple scattered microhemorrhages w/ old L temporal lobe ICH. Small vessel disease. Atrophy.   Repeat CT head in am pending   Carotid Doppler  pending   2D Echo pending   LDL 117  HgbA1c 5.1  SCDs for VTE prophylaxis  No antithrombotic prior to admission, now on No antithrombotic given hemorrhage   Therapy recommendations:  pending   Disposition:  pending   Keep in bed  today  Cytotoxic Cerebral Edema Induced Hypernatremia   CT/MRI w/ 73mm midline shift   Neursurgery consult  (Ostergard) - following, likely will not need evacuation  Started on hypertonic saline  Na 141-143-146  Goal Na 150-155  Will not aggressively pursue Na goal as ICH remains stable  Hypertensive Emergency  BP as high as 206/109  Home meds:  none  Treated w/ cleviprex, prn labetalol and hydralazine . SBP goal < 140 . Added norvasc 5 as cleared for po meds . Long-term BP goal normotensive  Hyperlipidemia  Home meds:  No statin  LDL 117, goal < 70 for ischemic infarct   Hold statin in setting of ICH  Dysphagia . Secondary to stroke . NPO x meds  . Speech on board   Other Stroke Risk Factors  Overweight, Body mass index is 27.02 kg/m., recommend weight loss, diet and exercise as appropriate   Other Active Problems  AKI Cre 1.34->1.20  Leukocytosis 11.0  Hospital day # 1  He presented with left hemiplegia secondary  to right basal ganglia hemorrhage from hypertensive emergency and likely has longstanding hypertension which has not been treated.  MRI also shows lacunar left periventricular subcortical infarct from small vessel disease.  Recommend close neurological monitoring as well as strict blood pressure control with systolic blood pressure goal below 140 for 24 hours and then below 160 later.  Check echocardiogram, lipid profile hemoglobin A1c.  Mobilize out of bed.  Therapy consults.  No family available for discussion.This patient is critically ill and at significant risk of neurological worsening, death and care requires constant monitoring of vital signs, hemodynamics,respiratory and cardiac monitoring, extensive review of multiple databases, frequent neurological assessment, discussion with family, other specialists and medical decision making of high complexity.I have made any additions or clarifications directly to the above note.This critical care time  does not reflect procedure time, or teaching time or supervisory time of PA/NP/Med Resident etc but could involve care discussion time.  I spent 30 minutes of neurocritical care time  in the care of  this patient.    Delia HeadyPramod Rowin Bayron, MD  To contact Stroke Continuity provider, please refer to [email or web address]. After hours, contact General Neurology

## 2019-07-11 NOTE — Progress Notes (Signed)
BP still elevated after prn med, cleviprex gtt restarted.

## 2019-07-11 NOTE — Progress Notes (Signed)
  Echocardiogram 2D Echocardiogram has been performed.  Jonathan Flowers 07/11/2019, 3:18 PM

## 2019-07-11 NOTE — Progress Notes (Signed)
PT Cancellation Note  Patient Details Name: Jonathan Flowers MRN: [id number] DOB: Dec 29, 1960   Cancelled Treatment:    Reason Eval/Treat Not Completed: Patient not medically ready. Pt remains on strict bed rest per Neuro team. PT to return as able.  [name], PT, DPT Acute Rehabilitation Services Pager #: [phone] Office #: [phone]    [name] 07/11/2019, 9:31 AM

## 2019-07-11 NOTE — Evaluation (Signed)
Clinical/Bedside Swallow Evaluation Patient Details  Name: Jonathan Flowers MRN: [id number] Date of Birth: 1960-12-30  Today's Date: 07/11/2019 Time: SLP Start Time (ACUTE ONLY): 2025 SLP Stop Time (ACUTE ONLY): 0854 SLP Time Calculation (min) (ACUTE ONLY): 19 min  Past Medical History:  Past Medical History:  Diagnosis Date  . HTN (hypertension)    Past Surgical History: History reviewed. No pertinent surgical history. HPI:   59 y.o. man w/ acute onset headache with left sided weakness. 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   Assessment / Plan / Recommendation Clinical Impression  Pt presents with significant lethargy and moderate left CN VII weakness and CN V sensory loss impacting oral control. With care assisted feeding pt subjectively does well with single boluses from a cup or spoon. When self feeding with assist and attempting consecutive trials there is immediate, weak coughing. Pts arousal needs to improve for better regulation of timing of swallow mechanism and awareness of oral phase dysphagia. For now pt can be given meds whole in puree, ice chips or small controlled sips of water with RN. Will f/u tomorrow to hopefully advance diet as arousal improves. SLP Visit Diagnosis: Dysphagia, oropharyngeal phase (R13.12)    Aspiration Risk  Moderate aspiration risk    Diet Recommendation NPO except meds;Ice chips PRN after oral care   Liquid Administration via: Spoon;Cup Medication Administration: Whole meds with puree Supervision: Staff to assist with self feeding;Full supervision/cueing for compensatory strategies Compensations: Slow rate;Small sips/bites Postural Changes: Seated upright at 90 degrees    Other  Recommendations Oral Care Recommendations: Oral care QID Other Recommendations: Have oral suction available   Follow up Recommendations Inpatient Rehab      Frequency and Duration min 2x/week  2  weeks       Prognosis Prognosis for Safe Diet Advancement: Good      Swallow Study   General HPI:  59 y.o. man w/ acute onset headache with left sided weakness. 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 Type of Study: Bedside Swallow Evaluation Previous Swallow Assessment: none Diet Prior to this Study: NPO Temperature Spikes Noted: No Respiratory Status: Room air History of Recent Intubation: No Behavior/Cognition: Distractible;Lethargic/Drowsy;Requires cueing Oral Care Completed by SLP: No Oral Cavity - Dentition: Adequate natural dentition Vision: Impaired for self-feeding Self-Feeding Abilities: Needs assist Patient Positioning: Upright in bed Baseline Vocal Quality: Normal Volitional Cough: Weak Volitional Swallow: Able to elicit    Oral/Motor/Sensory Function Overall Oral Motor/Sensory Function: Moderate impairment Facial ROM: Reduced left;Suspected CN VII (facial) dysfunction Facial Symmetry: Abnormal symmetry left;Suspected CN VII (facial) dysfunction Facial Strength: Reduced left;Suspected CN VII (facial) dysfunction Facial Sensation: Reduced left;Suspected CN V (Trigeminal) dysfunction Lingual ROM: Within Functional Limits Lingual Symmetry: Within Functional Limits Lingual Strength: Within Functional Limits   Ice Chips Ice chips: Within functional limits   Thin Liquid Thin Liquid: Impaired Presentation: Cup;Spoon;Straw Oral Phase Functional Implications: Right anterior spillage Pharyngeal  Phase Impairments: Cough - Immediate    Nectar Thick Nectar Thick Liquid: Impaired Presentation: Straw Oral phase functional implications: Right anterior spillage Pharyngeal Phase Impairments: Cough - Immediate   Honey Thick Honey Thick Liquid: Not tested   Puree Puree: Impaired Presentation: Spoon Oral Phase Functional Implications: Prolonged oral transit;Right anterior spillage   Solid      Solid: Not tested      Jonathan Flowers 07/11/2019,9:21 AM

## 2019-07-12 ENCOUNTER — Inpatient Hospital Stay (HOSPITAL_COMMUNITY): Payer: 59

## 2019-07-12 ENCOUNTER — Other Ambulatory Visit: Payer: Self-pay

## 2019-07-12 DIAGNOSIS — I610 Nontraumatic intracerebral hemorrhage in hemisphere, subcortical: Secondary | ICD-10-CM

## 2019-07-12 DIAGNOSIS — G936 Cerebral edema: Secondary | ICD-10-CM

## 2019-07-12 DIAGNOSIS — G935 Compression of brain: Secondary | ICD-10-CM

## 2019-07-12 LAB — SODIUM
Sodium: 152 mmol/L — ABNORMAL HIGH (ref 135–145)
Sodium: 154 mmol/L — ABNORMAL HIGH (ref 135–145)

## 2019-07-12 LAB — BASIC METABOLIC PANEL
Anion gap: 11 (ref 5–15)
BUN: 8 mg/dL (ref 6–20)
CO2: 21 mmol/L — ABNORMAL LOW (ref 22–32)
Calcium: 8.7 mg/dL — ABNORMAL LOW (ref 8.9–10.3)
Chloride: 117 mmol/L — ABNORMAL HIGH (ref 98–111)
Creatinine, Ser: 1.15 mg/dL (ref 0.61–1.24)
GFR calc Af Amer: >60 mL/min (ref >60)
GFR calc non Af Amer: >60 mL/min (ref >60)
Glucose, Bld: 141 mg/dL — ABNORMAL HIGH (ref 70–99)
Potassium: 3.5 mmol/L (ref 3.5–5.1)
Sodium: 149 mmol/L — ABNORMAL HIGH (ref 135–145)

## 2019-07-12 LAB — CBC
HCT: 41 % (ref 39.0–52.0)
Hemoglobin: 13.8 g/dL (ref 13.0–17.0)
MCH: 20.9 pg — ABNORMAL LOW (ref 26.0–34.0)
MCHC: 33.7 g/dL (ref 30.0–36.0)
MCV: 62.2 fL — ABNORMAL LOW (ref 80.0–100.0)
Platelets: 295 10*3/uL (ref 150–400)
RBC: 6.59 MIL/uL — ABNORMAL HIGH (ref 4.22–5.81)
RDW: 18.7 % — ABNORMAL HIGH (ref 11.5–15.5)
WBC: 10.3 10*3/uL (ref 4.0–10.5)
nRBC: 0 % (ref 0.0–0.2)

## 2019-07-12 MED ORDER — CHLORHEXIDINE GLUCONATE 0.12 % MT SOLN
15.0000 mL | Freq: Two times a day (BID) | OROMUCOSAL | Status: DC
  Administered 2019-07-12 – 2019-07-21 (×19): 15 mL via OROMUCOSAL
  Filled 2019-07-12 (×17): qty 15

## 2019-07-12 MED ORDER — SODIUM CHLORIDE 3 % IV SOLN
INTRAVENOUS | Status: DC
  Administered 2019-07-12 (×2): 75 mL/h via INTRAVENOUS
  Filled 2019-07-12 (×5): qty 500

## 2019-07-12 MED ORDER — CLEVIDIPINE BUTYRATE 0.5 MG/ML IV EMUL
INTRAVENOUS | Status: AC
  Administered 2019-07-12: 50 mg
  Filled 2019-07-12: qty 50

## 2019-07-12 MED ORDER — HYDROCHLOROTHIAZIDE 25 MG PO TABS
25.0000 mg | ORAL_TABLET | Freq: Every day | ORAL | Status: DC
  Administered 2019-07-12 – 2019-07-16 (×5): 25 mg via ORAL
  Filled 2019-07-12 (×5): qty 1

## 2019-07-12 MED ORDER — LABETALOL HCL 5 MG/ML IV SOLN
20.0000 mg | INTRAVENOUS | Status: DC | PRN
  Administered 2019-07-12: 40 mg via INTRAVENOUS
  Administered 2019-07-12 – 2019-07-14 (×3): 20 mg via INTRAVENOUS
  Filled 2019-07-12: qty 4
  Filled 2019-07-12: qty 8
  Filled 2019-07-12 (×2): qty 4

## 2019-07-12 MED ORDER — ORAL CARE MOUTH RINSE
15.0000 mL | Freq: Two times a day (BID) | OROMUCOSAL | Status: DC
  Administered 2019-07-12 – 2019-07-21 (×19): 15 mL via OROMUCOSAL

## 2019-07-12 MED ORDER — HYDRALAZINE HCL 20 MG/ML IJ SOLN
10.0000 mg | Freq: Four times a day (QID) | INTRAMUSCULAR | Status: DC | PRN
  Administered 2019-07-12 – 2019-07-13 (×2): 10 mg via INTRAVENOUS
  Filled 2019-07-12 (×2): qty 1

## 2019-07-12 NOTE — Progress Notes (Signed)
PT Cancellation Note  Patient Details Name: Jonathan Flowers MRN: [id number] DOB: 1960/03/25   Cancelled Treatment:    Reason Eval/Treat Not Completed: Active bedrest order. Neuro team continues to have pt on bedrest with planes to potentially advance mobility tomorrow. PT to return as able as appropriate to complete PT eval.  [name], PT, DPT Acute Rehabilitation Services Pager #: [phone] Office #: [phone]    [name] 07/12/2019, 10:06 AM

## 2019-07-12 NOTE — Progress Notes (Signed)
Carotid duplex       has been completed. Preliminary results can be found under CV proc through chart review. Audrey Eller, BS, RDMS, RVT   

## 2019-07-12 NOTE — Progress Notes (Signed)
Updated Neuro MD Aroor about patient's sodium of 154. This RN was told to continue running 3% at a rate of 75 mL/hour. Will continue to monitor.

## 2019-07-12 NOTE — Progress Notes (Signed)
SLP Cancellation Note  Patient Details Name: Jonathan Flowers MRN: [id number] DOB: 19-Nov-1960   Cancelled treatment:       Reason Eval/Treat Not Completed: Fatigue/lethargy limiting ability to participate. Pt still significantly lethargic. RN reports some coughing with small sips of water yesterday, but puree and ice are tolerated well. Continue ice and puree for meds and comfort. Will await improved arousal for further trials or MBS. May need cortrak tomorrow if not more alert by then.    Anthony Tamburo, Riley Nearing 07/12/2019, 9:20 AM

## 2019-07-12 NOTE — Progress Notes (Signed)
STROKE TEAM PROGRESS NOTE   INTERVAL HISTORY Patient appears sleepier and harder to arouse this morning but does follow commands.  Serum sodium was low and hypertonic saline drip as been increased to 75 cc an hour.  And last serum sodium is 149 this a.m.  No family at the bedside.  Vital signs stable.  Blood pressure adequately controlled CT scan of the head from this morning shows unchanged size of 5.7 cm right basal ganglia hemorrhage with surrounding edema which appears slightly increased but mass-effect and midline shift of 6 mm appears unchanged. Vitals:   07/12/19 0645 07/12/19 0700 07/12/19 0753 07/12/19 0800  BP: (!) 155/79 (!) 158/88  (!) 147/99  Pulse: (!) 103 (!) 106 (!) 110 (!) 106  Resp: (!) 22 (!) 23 (!) 24 (!) 22  Temp:    97.8 F (36.6 C)  TempSrc:    Oral  SpO2: 94% 97% 97% 94%  Weight:      Height:        CBC:  Recent Labs  Lab 07/10/19 1006 07/10/19 1006 07/10/19 1008 07/12/19 0541  WBC 11.0*  --   --  10.3  NEUTROABS 6.5  --   --   --   HGB 14.7   < > 16.0 13.8  HCT 44.9   < > 47.0 41.0  MCV 63.5*  --   --  62.2*  PLT 315  --   --  295   < > = values in this interval not displayed.    Basic Metabolic Panel:  Recent Labs  Lab 07/10/19 1006 07/10/19 1006 07/10/19 1008 07/10/19 1256 07/11/19 2228 07/12/19 0541  NA 137   < > 140   < > 141 149*  K 3.8   < > 3.8  --   --  3.5  CL 104   < > 102  --   --  117*  CO2 21*  --   --   --   --  21*  GLUCOSE 150*   < > 146*  --   --  141*  BUN 19   < > 21*  --   --  8  CREATININE 1.34*   < > 1.20  --   --  1.15  CALCIUM 8.8*  --   --   --   --  8.7*   < > = values in this interval not displayed.   Lipid Panel:     Component Value Date/Time   CHOL 198 07/10/2019 1256   TRIG 124 07/10/2019 1256   HDL 56 07/10/2019 1256   CHOLHDL 3.5 07/10/2019 1256   VLDL 25 07/10/2019 1256   LDLCALC 117 (H) 07/10/2019 1256   HgbA1c:  Lab Results  Component Value Date   HGBA1C 5.1 07/10/2019   Urine Drug Screen:      Component Value Date/Time   LABOPIA NONE DETECTED 07/10/2019 1800   COCAINSCRNUR NONE DETECTED 07/10/2019 1800   LABBENZ NONE DETECTED 07/10/2019 1800   AMPHETMU NONE DETECTED 07/10/2019 1800   THCU NONE DETECTED 07/10/2019 1800   LABBARB NONE DETECTED 07/10/2019 1800    Alcohol Level     Component Value Date/Time   ETH <10 07/10/2019 1256    IMAGING past 24 hours CT HEAD WO CONTRAST  Result Date: 07/12/2019 CLINICAL DATA:  Stroke, follow-up. EXAM: CT HEAD WITHOUT CONTRAST TECHNIQUE: Contiguous axial images were obtained from the base of the skull through the vertex without intravenous contrast. COMPARISON:  Brain MRI 07/11/2019, CT angiogram head/neck  07/10/2019 FINDINGS: Brain: Unchanged size of an acute/early subacute parenchymal hemorrhage centered within the right basal ganglia and radiating white matter tracks. As before, the hemorrhage measures 5.7 x 3.1 x 4.5 cm (AP x TV x CC). Surrounding edema has slightly increased. As before, there is significant partial effacement of the right lateral ventricle. Unchanged 6 mm leftward midline shift. No intraventricular extension of hemorrhage at this time. A small acute ischemic infarct within the left periatrial white matter was better appreciated on prior MRI 07/11/2018. Additional patchy hypoattenuation within the cerebral white matter is nonspecific, but consistent with chronic small vessel ischemic disease. No acute demarcated cortical infarct is identified. No extra-axial fluid collection. No evidence of intracranial mass. Vascular: No hyperdense vessel.  Atherosclerotic calcifications. Skull: Normal. Negative for fracture or focal lesion. Sinuses/Orbits: Visualized orbits show no acute finding. Mild ethmoid and right maxillary sinus mucosal thickening. No significant mastoid effusion. IMPRESSION: Unchanged size of 5.7 cm acute/early subacute parenchymal hemorrhage centered within the right basal ganglia and radiating white matter tracks.  Surrounding edema has slightly increased. Mass effect has not significantly changed with persistent partial effacement of the right lateral ventricle and 6 mm leftward midline shift. No intraventricular extension of hemorrhage at this time. A small acute ischemic infarct within the left periatrial white matter was better appreciated on prior MRI 07/10/2019. Stable background chronic small vessel ischemic changes within the cerebral white matter. Mild paranasal sinus mucosal thickening. Electronically Signed   By: Jackey Loge DO   On: 07/12/2019 08:19   ECHOCARDIOGRAM COMPLETE  Result Date: 07/11/2019    ECHOCARDIOGRAM REPORT   Patient Name:   Y BAP Abbruzzese  Date of Exam: 07/11/2019 Medical Rec #:  [id number]  Height:       64.0 in Accession #:    [id number] Weight:       157.4 lb Date of Birth:  09/06/1960  BSA:          1.767 m Patient Age:    58 years   BP:           162/90 mmHg Patient Gender: M          HR:           84 bpm. Exam Location:  Inpatient Procedure: 2D Echo, Cardiac Doppler and Color Doppler Indications:    Stroke 434.9 / I163.9  History:        Patient has no prior history of Echocardiogram examinations.                 Risk Factors:Hypertension.  Sonographer:    Elmarie Shiley Dance Referring Phys: [id number] SHARON L BIBY IMPRESSIONS  1. Left ventricular ejection fraction, by estimation, is 55 to 60%. The left ventricle has normal function. The left ventricle has no regional wall motion abnormalities. There is mild left ventricular hypertrophy. Left ventricular diastolic parameters are consistent with Grade I diastolic dysfunction (impaired relaxation).  2. Right ventricular systolic function is normal. The right ventricular size is normal. There is normal pulmonary artery systolic pressure. The estimated right ventricular systolic pressure is 29.0 mmHg.  3. The mitral valve is abnormal. Trivial mitral valve regurgitation.  4. The aortic valve is tricuspid. Aortic valve regurgitation is not visualized.  5. Aortic  dilatation noted. There is mild dilatation at the level of the sinuses of Valsalva measuring 40 mm.  6. The inferior vena cava is normal in size with greater than 50% respiratory variability, suggesting right atrial pressure of 3 mmHg. FINDINGS  Left Ventricle: Left  ventricular ejection fraction, by estimation, is 55 to 60%. The left ventricle has normal function. The left ventricle has no regional wall motion abnormalities. The left ventricular internal cavity size was normal in size. There is  mild left ventricular hypertrophy. Left ventricular diastolic parameters are consistent with Grade I diastolic dysfunction (impaired relaxation). Indeterminate filling pressures. Right Ventricle: The right ventricular size is normal. No increase in right ventricular wall thickness. Right ventricular systolic function is normal. There is normal pulmonary artery systolic pressure. The tricuspid regurgitant velocity is 2.55 m/s, and  with an assumed right atrial pressure of 3 mmHg, the estimated right ventricular systolic pressure is 88.8 mmHg. Left Atrium: Left atrial size was normal in size. Right Atrium: Right atrial size was normal in size. Pericardium: There is no evidence of pericardial effusion. Mitral Valve: The mitral valve is abnormal. There is mild thickening of the mitral valve leaflet(s). Trivial mitral valve regurgitation. Tricuspid Valve: The tricuspid valve is grossly normal. Tricuspid valve regurgitation is trivial. Aortic Valve: The aortic valve is tricuspid. Aortic valve regurgitation is not visualized. Pulmonic Valve: The pulmonic valve was normal in structure. Pulmonic valve regurgitation is not visualized. Aorta: Aortic dilatation noted. There is mild dilatation at the level of the sinuses of Valsalva measuring 40 mm. Venous: The inferior vena cava is normal in size with greater than 50% respiratory variability, suggesting right atrial pressure of 3 mmHg. IAS/Shunts: No atrial level shunt detected by color  flow Doppler.  LEFT VENTRICLE PLAX 2D LVIDd:         5.52 cm  Diastology LVIDs:         4.14 cm  LV e' lateral:   5.00 cm/s LV PW:         1.18 cm  LV E/e' lateral: 14.1 LV IVS:        1.14 cm  LV e' medial:    5.00 cm/s LVOT diam:     2.10 cm  LV E/e' medial:  14.1 LV SV:         57 LV SV Index:   32 LVOT Area:     3.46 cm  RIGHT VENTRICLE             IVC RV Basal diam:  2.62 cm     IVC diam: 1.93 cm RV S prime:     13.20 cm/s TAPSE (M-mode): 2.3 cm LEFT ATRIUM             Index       RIGHT ATRIUM           Index LA diam:        3.70 cm 2.09 cm/m  RA Area:     16.10 cm LA Vol (A2C):   71.1 ml 40.24 ml/m RA Volume:   36.00 ml  20.37 ml/m LA Vol (A4C):   46.4 ml 26.26 ml/m LA Biplane Vol: 57.4 ml 32.48 ml/m  AORTIC VALVE LVOT Vmax:   75.40 cm/s LVOT Vmean:  50.000 cm/s LVOT VTI:    0.164 m  AORTA Ao Root diam: 4.00 cm Ao Asc diam:  3.60 cm MITRAL VALVE               TRICUSPID VALVE MV Area (PHT): 2.91 cm    TR Peak grad:   26.0 mmHg MV Decel Time: 261 msec    TR Vmax:        255.00 cm/s MV E velocity: 70.30 cm/s MV A velocity: 78.10 cm/s  SHUNTS MV E/A ratio:  0.90  Systemic VTI:  0.16 m                            Systemic Diam: 2.10 cm Zoila ShutterKenneth Hilty MD Electronically signed by Zoila ShutterKenneth Hilty MD Signature Date/Time: 07/11/2019/4:28:41 PM    Final     PHYSICAL EXAM   Pleasant middle-aged Asian male not in distress. . Afebrile. Head is nontraumatic. Neck is supple without bruit.    Cardiac exam no murmur or gallop. Lungs are clear to auscultation. Distal pulses are well felt. Neurological Exam :  Drowsy but can be aroused to follow simple commands.  He has diminished attention, registration and recall.  Right gaze preference but able to look to the left past midline.  Speech is nonfluent but does speak few words and short sentences.  Follows simple midline and one-step commands.  Left lower facial weakness.  Dense left hemiplegia with only trace withdrawal in the lower extremity to pain and none in the  upper extremity.  Tone is reduced on the left with flaccidity.  Sensation is preserved on the right and reduced on the left.  Right plantar downgoing left upgoing.  Gait not tested.  ASSESSMENT/PLAN Mr. Porfirio MylarY Bap Ellinwood is a 59 y.o. male with history of HTN presenting with R gaze deviation and L sided flacidity. In ED developed less responsive state, HTN emergency and urine incontinence.   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   CT head unchanged R basal ganglia ICH w/ 6mm shift.   CTA head no LVO. Distal L A2 moderate/severe stenosis. Distal R A2 moderate stenosis.   MRI  Stable R basal ganglia ICH. L periatrial white matter infarct. Multiple scattered microhemorrhages w/ old L temporal lobe ICH. Small vessel disease. Atrophy.   Repeat CT head in am unchanged R basal ganglia hemorrhage, increased edema. Mass effect and 6mm midline shift same. Small L parietal white matter infarct. Sinus dz.   Carotid Doppler pending  2D Echo EF 55-60%. No source of embolus    LDL 117  HgbA1c 5.1  SCDs for VTE prophylaxis  No antithrombotic prior to admission, now on No antithrombotic given hemorrhage   Therapy recommendations:  pending   Disposition:  pending   Keep in ICU today  Keep in bed today  Cytotoxic Cerebral Edema Induced Hypernatremia   CT/MRI w/ 6mm midline shift, unchanged  Neursurgery consult  (Ostergard) - following, likely will not need evacuation  Started on hypertonic saline via PIV  Na 141-143-146-141-149-152  Goal Na 150-155  Continue 3% @ 75/hr via PIV  Anticipate stopping tomorrow  Hypertensive Emergency  BP as high as 206/109  Home meds:  none  Treated w/ cleviprex, prn labetalol 20-40 and  hydralazine 10 q6h . Increase SBP goal < 160 . On norvasc 5, add HCTZ 25 . Long-term BP goal normotensive  Hyperlipidemia  Home meds:  No statin  LDL 117, goal < 70 for ischemic infarct   Hold statin in setting of ICH  Dysphagia . Secondary to stroke . NPO x meds  . Speech on board   Other Stroke Risk Factors  Overweight, Body mass index is 27.02 kg/m., recommend weight loss, diet and exercise as appropriate  Other Active Problems  AKI Cre 1.34->1.20->1.15 - resolved  Leukocytosis 11.0->10.3 - resolved  Hospital day # 2  Continue close neurological monitoring and strict blood pressure control and hypertonic saline drip with serum sodium goal 150-155.  Use IV hydralazine as needed and add hydrochlorothiazide for hypertension.  Speech therapy for swallow eval.  He remains at risk for neurological worsening and development of hydrocephalus and increased cerebral edema.  No family available at the bedside for discussion.This patient is critically ill and at significant risk of neurological worsening, death and care requires constant monitoring of vital signs, hemodynamics,respiratory and cardiac monitoring, extensive review of multiple databases, frequent neurological assessment, discussion with family, other specialists and medical decision making of high complexity.I have made any additions or clarifications directly to the above note.This critical care time does not reflect procedure time, or teaching time or supervisory time of PA/NP/Med Resident etc but could involve care discussion time.  I spent 30 minutes of neurocritical care time  in the care of  this patient.       Delia Heady, MD  To contact Stroke Continuity provider, please refer to [email or web address]. After hours, contact General Neurology

## 2019-07-12 NOTE — Progress Notes (Signed)
OT Cancellation Note  Patient Details Name: Jonathan Flowers MRN: [id number] DOB: 05/02/1960   Cancelled Treatment:    Reason Eval/Treat Not Completed: Patient not medically ready(per MD remains on bedrest)  Thornell Mule, OT/L   Acute OT Clinical Specialist Acute Rehabilitation Services Pager [phone] Office [phone]  07/12/2019, 10:11 AM

## 2019-07-12 NOTE — Progress Notes (Signed)
Updated Neuro MD Aroor about patient's sodium level of 141. MD made aware that patient only has peripheral IVs. Verbal order to increase rate to 75 mL/hour. Hypertonic saline IV site clean dry and intact. Will continue to monitor.

## 2019-07-13 DIAGNOSIS — N179 Acute kidney failure, unspecified: Secondary | ICD-10-CM

## 2019-07-13 DIAGNOSIS — I10 Essential (primary) hypertension: Secondary | ICD-10-CM

## 2019-07-13 DIAGNOSIS — R739 Hyperglycemia, unspecified: Secondary | ICD-10-CM

## 2019-07-13 DIAGNOSIS — I619 Nontraumatic intracerebral hemorrhage, unspecified: Secondary | ICD-10-CM

## 2019-07-13 DIAGNOSIS — R0682 Tachypnea, not elsewhere classified: Secondary | ICD-10-CM

## 2019-07-13 DIAGNOSIS — R000 Tachycardia, unspecified: Secondary | ICD-10-CM

## 2019-07-13 DIAGNOSIS — Z9981 Dependence on supplemental oxygen: Secondary | ICD-10-CM

## 2019-07-13 DIAGNOSIS — I610 Nontraumatic intracerebral hemorrhage in hemisphere, subcortical: Principal | ICD-10-CM

## 2019-07-13 DIAGNOSIS — E870 Hyperosmolality and hypernatremia: Secondary | ICD-10-CM

## 2019-07-13 DIAGNOSIS — E876 Hypokalemia: Secondary | ICD-10-CM

## 2019-07-13 LAB — BASIC METABOLIC PANEL
Anion gap: 11 (ref 5–15)
BUN: 9 mg/dL (ref 6–20)
CO2: 25 mmol/L (ref 22–32)
Calcium: 9.2 mg/dL (ref 8.9–10.3)
Chloride: 126 mmol/L — ABNORMAL HIGH (ref 98–111)
Creatinine, Ser: 1.49 mg/dL — ABNORMAL HIGH (ref 0.61–1.24)
GFR calc Af Amer: 60 mL/min — ABNORMAL LOW (ref >60)
GFR calc non Af Amer: 52 mL/min — ABNORMAL LOW (ref >60)
Glucose, Bld: 127 mg/dL — ABNORMAL HIGH (ref 70–99)
Potassium: 3.2 mmol/L — ABNORMAL LOW (ref 3.5–5.1)
Sodium: 162 mmol/L (ref 135–145)

## 2019-07-13 LAB — CBC
HCT: 42 % (ref 39.0–52.0)
Hemoglobin: 14 g/dL (ref 13.0–17.0)
MCH: 20.8 pg — ABNORMAL LOW (ref 26.0–34.0)
MCHC: 33.3 g/dL (ref 30.0–36.0)
MCV: 62.4 fL — ABNORMAL LOW (ref 80.0–100.0)
Platelets: 273 10*3/uL (ref 150–400)
RBC: 6.73 MIL/uL — ABNORMAL HIGH (ref 4.22–5.81)
RDW: 18.9 % — ABNORMAL HIGH (ref 11.5–15.5)
WBC: 9.8 10*3/uL (ref 4.0–10.5)
nRBC: 0 % (ref 0.0–0.2)

## 2019-07-13 LAB — SODIUM: Sodium: 162 mmol/L (ref 135–145)

## 2019-07-13 MED ORDER — AMLODIPINE BESYLATE 10 MG PO TABS
10.0000 mg | ORAL_TABLET | Freq: Every day | ORAL | Status: DC
  Administered 2019-07-13 – 2019-07-21 (×9): 10 mg via ORAL
  Filled 2019-07-13 (×9): qty 1

## 2019-07-13 MED ORDER — LOSARTAN POTASSIUM 25 MG PO TABS
25.0000 mg | ORAL_TABLET | Freq: Every day | ORAL | Status: DC
  Administered 2019-07-13 – 2019-07-19 (×7): 25 mg via ORAL
  Filled 2019-07-13 (×7): qty 1

## 2019-07-13 MED ORDER — POTASSIUM CHLORIDE CRYS ER 20 MEQ PO TBCR
20.0000 meq | EXTENDED_RELEASE_TABLET | ORAL | Status: AC
  Administered 2019-07-13 (×2): 20 meq via ORAL
  Filled 2019-07-13 (×2): qty 1

## 2019-07-13 MED ORDER — HYDRALAZINE HCL 20 MG/ML IJ SOLN
20.0000 mg | Freq: Four times a day (QID) | INTRAMUSCULAR | Status: DC | PRN
  Administered 2019-07-14 – 2019-07-20 (×4): 20 mg via INTRAVENOUS
  Filled 2019-07-13 (×4): qty 1

## 2019-07-13 MED ORDER — POTASSIUM CHLORIDE 10 MEQ/100ML IV SOLN
10.0000 meq | INTRAVENOUS | Status: AC
  Administered 2019-07-13 (×4): 10 meq via INTRAVENOUS
  Filled 2019-07-13 (×4): qty 100

## 2019-07-13 MED ORDER — RESOURCE THICKENUP CLEAR PO POWD
ORAL | Status: DC | PRN
  Filled 2019-07-13: qty 125

## 2019-07-13 NOTE — Progress Notes (Signed)
CRITICAL VALUE ALERT  Critical Value:  Sodium 162  Date & Time Notied:  07/13/2019 @ 0155  Provider Notified: Dr. Laurence Slate  Orders Received/Actions taken: Turned of hypertonic saline infusion. No new orders from MD.

## 2019-07-13 NOTE — Evaluation (Signed)
Occupational Therapy Evaluation Patient Details Name: Jonathan Flowers MRN: [id number] DOB: December 31, 1960 Today's Date: 07/13/2019    History of Present Illness Jonathan Flowers is an 59 y.o. male with a PMHx of HTN who presented to the [facility] ED as a code stroke for acute onset of right gaze deviation and left sided flaccid weakness. 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 Surrounding edema. Mass effect with right-to-left shift of 6 mm.   Clinical Impression   Pt admitted with the above diagnosis and has the deficits listed below. Pt would benefit from cont OT to increase independence with basic adls and adl transfers as well as improve safety with mobility so he can eventually d/c home with his wife.  Pt currently requires a great amount of care and is a safety risk with all mobility as he pushes self to his weak side. Pt would benefit from intense rehab before returning home with his family to maximize safety and independence with all adls and mobility.      Follow Up Recommendations  CIR;Supervision/Assistance - 24 hour    Equipment Recommendations  3 in 1 bedside commode    Recommendations for Other Services Rehab consult     Precautions / Restrictions Precautions Precautions: Fall Restrictions Weight Bearing Restrictions: No Other Position/Activity Restrictions: Pt pushes  heavily to his weak L side.      Mobility Bed Mobility Overal bed mobility: (P) Needs Assistance Bed Mobility: (P) Supine to Sit     Supine to sit: (P) Max assist;+2 for physical assistance     General bed mobility comments: Pt limited due to L neglect and inability to use L side.  Transfers Overall transfer level: (P) Needs assistance Equipment used: (P) 2 person hand held assist Transfers: (P) Squat Pivot Transfers     Squat pivot transfers: (P) Total assist;+2 physical assistance     General transfer comment: (P) limited by lethargy, pushing to  left    Balance Overall balance assessment: (P) Needs assistance Sitting-balance support: (P) Bilateral upper extremity supported;Feet supported Sitting balance-Leahy Scale: (P) Zero Sitting balance - Comments: (P) maxA, heavy pushing to left Postural control: (P) Left lateral lean Standing balance support: (P) During functional activity Standing balance-Leahy Scale: (P) Zero                             ADL either performed or assessed with clinical judgement   ADL Overall ADL's : Needs assistance/impaired Eating/Feeding: NPO   Grooming: Wash/dry face;Minimal assistance;Cueing for compensatory techniques;Sitting Grooming Details (indicate cue type and reason): required cues to wash the L side of his face. Upper Body Bathing: Maximal assistance;Sitting;Cueing for compensatory techniques   Lower Body Bathing: Maximal assistance;Sitting/lateral leans;Cueing for compensatory techniques;Cueing for sequencing   Upper Body Dressing : Total assistance;Sitting;Cueing for sequencing;Cueing for safety;Cueing for compensatory techniques   Lower Body Dressing: Total assistance;+2 for physical assistance;Sit to/from stand;Cueing for compensatory techniques;Cueing for safety;Cueing for sequencing   Toilet Transfer: Total assistance;+2 for physical assistance;Squat-pivot;BSC;Requires drop arm Toilet Transfer Details (indicate cue type and reason): Pt heavy pusher to weak side.  Helps to have pt transfer to strong side and reach with his R arm to arm rail OR take the use of R arm completely away and squat pivot. Toileting- Clothing Manipulation and Hygiene: Total assistance;+2 for physical assistance;Sitting/lateral lean;Cueing for compensatory techniques;Cueing for sequencing       Functional mobility during ADLs: Total assistance;+2 for  physical assistance General ADL Comments: Pt very limited with all adls due to poor sitting balance, lethargy, flaccid L side, and cognitive deficts.      Vision Baseline Vision/History: No visual deficits Vision Assessment?: Vision impaired- to be further tested in functional context Additional Comments: unable to keep eyes open long enough to test.     Perception Perception Perception Tested?: Yes Perception Deficits: Inattention/neglect Inattention/Neglect: Does not attend to left visual field;Does not attend to left side of body   Praxis Praxis Praxis tested?: Not tested    Pertinent Vitals/Pain Pain Assessment: Faces Pain Score: 4  Faces Pain Scale: Hurts little more Pain Location: head Pain Descriptors / Indicators: Aching Pain Intervention(s): Monitored during session     Hand Dominance     Extremity/Trunk Assessment Upper Extremity Assessment Upper Extremity Assessment: Defer to OT evaluation LUE Deficits / Details: PROM WFL.  No active movement noted. LUE Sensation: decreased light touch;decreased proprioception LUE Coordination: decreased fine motor;decreased gross motor   Lower Extremity Assessment Lower Extremity Assessment: LLE deficits/detail;RLE deficits/detail RLE Deficits / Details: (P) RUE often pushing to left side LLE Deficits / Details: (P) flaccid, does withdraw to pain, otherwise no AROM noted   Cervical / Trunk Assessment Cervical / Trunk Assessment: (P) Other exceptions Cervical / Trunk Exceptions: (P) cervical rotation to R with some L lateral neck flexion noted at rest   Communication Communication Communication: (pt understands english, soft-spoken)   Cognition Arousal/Alertness: Lethargic Behavior During Therapy: Impulsive Overall Cognitive Status: Impaired/Different from baseline Area of Impairment: Orientation;Attention;Memory;Following commands;Safety/judgement;Awareness;Problem solving                 Orientation Level: Disoriented to;Situation;Place Current Attention Level: Focused Memory: Decreased recall of precautions;Decreased short-term memory Following Commands:  Follows one step commands consistently Safety/Judgement: Decreased awareness of safety;Decreased awareness of deficits Awareness: Intellectual Problem Solving: Slow processing;Requires verbal cues General Comments: Pt required extra time to answer questions.  Pt thought he was "in the air" but did not know he was in a hospital.    General Comments  (P) pt with eyes closed for majority of session but does follow commands and opens eyes to cues. Slowed processing time. Pt with R gaze deviations, at best can track to midline    Exercises     Shoulder Instructions      Home Living Family/patient expects to be discharged to:: Private residence Living Arrangements: Spouse/significant other Available Help at Discharge: Other (Comment)(TBD) Type of Home: House Home Access: Stairs to enter [employer] of Steps: 2(pt reports 2 separate steps) Entrance Stairs-Rails: None Home Layout: One level     Bathroom Shower/Tub: Producer, television/film/video: Standard     Home Equipment: None   Additional Comments: pt may be an unreliable historian 2/2 lethargy      Prior Functioning/Environment Level of Independence: Independent        Comments: 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        OT Problem List: Decreased strength;Decreased range of motion;Decreased activity tolerance;Impaired balance (sitting and/or standing);Impaired vision/perception;Decreased coordination;Decreased cognition;Decreased safety awareness;Decreased knowledge of use of DME or AE;Decreased knowledge of precautions;Impaired sensation;Impaired tone      OT Treatment/Interventions: Self-care/ADL training;DME and/or AE instruction;Therapeutic activities    OT Goals(Current goals can be found in the care plan section) Acute Rehab OT Goals Patient Stated Goal: none stated OT Goal Formulation: With patient Time For Goal Achievement: 07/27/19 Potential to Achieve Goals: Good ADL Goals Pt Will  Perform Eating:  with min assist;sitting Pt Will Perform Grooming: with min assist;sitting Pt Will Perform Upper Body Bathing: with min assist;sitting Pt Will Transfer to Toilet: with mod assist;bedside commode;stand pivot transfer Additional ADL Goal #1: Pt will locate adl items on L and R side of sink with minimal VCs to increase awareness of L side environment. Additional ADL Goal #2: Pt will tend to L arm during all mobility to ensure safety in placement of this limb during mobility.  OT Frequency: Min 2X/week   Barriers to D/C:    unsure of family support       Co-evaluation PT/OT/SLP Co-Evaluation/Treatment: Yes Reason for Co-Treatment: Complexity of the patient's impairments (multi-system involvement);Necessary to address cognition/behavior during functional activity;For patient/therapist safety;To address functional/ADL transfers PT goals addressed during session: Mobility/safety with mobility;Balance;Strengthening/ROM OT goals addressed during session: ADL's and self-care      AM-PAC OT "6 Clicks" Daily Activity     Outcome Measure Help from another person eating meals?: Total Help from another person taking care of personal grooming?: A Lot Help from another person toileting, which includes using toliet, bedpan, or urinal?: Total Help from another person bathing (including washing, rinsing, drying)?: Total Help from another person to put on and taking off regular upper body clothing?: Total Help from another person to put on and taking off regular lower body clothing?: Total 6 Click Score: 7   End of Session Equipment Utilized During Treatment: Oxygen Nurse Communication: Mobility status;Need for lift equipment  Activity Tolerance: Patient limited by lethargy Patient left: in chair;with call bell/phone within reach;with chair alarm set  OT Visit Diagnosis: Unsteadiness on feet (R26.81);Other abnormalities of gait and mobility (R26.89);Other symptoms and signs involving  the nervous system (R29.898);Other symptoms and signs involving cognitive function;Hemiplegia and hemiparesis Hemiplegia - Right/Left: Left Hemiplegia - dominant/non-dominant: Non-Dominant Hemiplegia - caused by: Nontraumatic intracerebral hemorrhage                Time: 7591-6384 OT Time Calculation (min): 31 min Charges:  OT General Charges $OT Visit: 1 Visit OT Evaluation $OT Eval Moderate Complexity: 1 Mod  Hope Budds 07/13/2019, 9:41 AM
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
## 2019-07-13 NOTE — Progress Notes (Signed)
Inpatient Rehabilitation Admissions Coordinator  Inpatient rehab consult received. I will follow his progress with participation to assist with planning rehab venue options. Please refer to Dr. Eliane Decree consult.  Ottie Glazier, RN, MSN Rehab Admissions Coordinator [phone] 07/13/2019 2:45 PM

## 2019-07-13 NOTE — Evaluation (Signed)
Physical Therapy Evaluation Patient Details Name: Jonathan Flowers MRN: [id number] DOB: 22-Dec-1960 Today's Date: 07/13/2019   History of Present Illness  Jonathan Flowers is an 59 y.o. male with a PMHx of HTN who presented to the [facility] ED as a code stroke for acute onset of right gaze deviation and left sided flaccid weakness. 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 Surrounding edema. Mass effect with right-to-left shift of 6 mm.  Clinical Impression  Pt presents to PT with deficits in functional mobility, gait, balance, endurance, strength, power, cognition, attention, vision. Pt with significant L neglect, pushing to the left often during session. Pt is flaccid on left side with no AROM noted. Pt does withdraw to painful stimuli with LLE. Pt is at a high falls risk due to weakness, visual-spatial awareness deficits, balance impairments, and impaired cognition. Pt will benefit from continued acute PT POC to improve mobility and reduce falls risk. PT currently recommends CIR as the pt was independent prior to admission and working, and demonstrates the potential to significantly improve mobility quality with high intensity inpatient PT services.    Follow Up Recommendations CIR    Equipment Recommendations  Wheelchair (measurements PT);Wheelchair cushion (measurements PT);Hospital bed(mechanical lift if home today)    Recommendations for Other Services Rehab consult     Precautions / Restrictions Precautions Precautions: Fall Restrictions Weight Bearing Restrictions: No Other Position/Activity Restrictions: Pt pushes  heavily to his weak L side.      Mobility  Bed Mobility Overal bed mobility: Needs Assistance Bed Mobility: Supine to Sit     Supine to sit: Max assist;+2 for physical assistance     General bed mobility comments: Pt limited due to L neglect and inability to use L side.  Transfers Overall transfer level: Needs  assistance Equipment used: 2 person hand held assist Transfers: Squat Pivot Transfers     Squat pivot transfers: Total assist;+2 physical assistance     General transfer comment: limited by lethargy, pushing to left  Ambulation/Gait                Stairs            Wheelchair Mobility    Modified Rankin (Stroke Patients Only)       Balance Overall balance assessment: Needs assistance Sitting-balance support: Bilateral upper extremity supported;Feet supported Sitting balance-Leahy Scale: Zero Sitting balance - Comments: maxA, heavy pushing to left Postural control: Left lateral lean Standing balance support: During functional activity Standing balance-Leahy Scale: Zero                               Pertinent Vitals/Pain Pain Assessment: Faces Pain Score: 4  Faces Pain Scale: Hurts little more Pain Location: head Pain Descriptors / Indicators: Aching Pain Intervention(s): Monitored during session    Home Living Family/patient expects to be discharged to:: Private residence Living Arrangements: Spouse/significant other Available Help at Discharge: Other (Comment)(TBD) Type of Home: House Home Access: Stairs to enter Entrance Stairs-Rails: None Entrance Stairs-Number of Steps: 2(pt reports 2 separate steps) Home Layout: One level Home Equipment: None Additional Comments: pt may be an unreliable historian 2/2 lethargy    Prior Function Level of Independence: Independent         Comments: 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  No active movement noted. LUE Sensation: decreased light touch;decreased proprioception LUE Coordination: decreased fine motor;decreased gross motor    Lower Extremity Assessment Lower Extremity Assessment: LLE deficits/detail;RLE  deficits/detail RLE Deficits / Details: RUE often pushing to left side LLE Deficits / Details: flaccid, does withdraw to pain, otherwise no AROM noted    Cervical / Trunk Assessment Cervical / Trunk Assessment: Other exceptions Cervical / Trunk Exceptions: cervical rotation to R with some L lateral neck flexion noted at rest  Communication   Communication: (pt understands english, soft-spoken)  Cognition Arousal/Alertness: Lethargic Behavior During Therapy: Impulsive Overall Cognitive Status: Impaired/Different from baseline Area of Impairment: Orientation;Attention;Memory;Following commands;Safety/judgement;Awareness;Problem solving                 Orientation Level: Disoriented to;Situation;Place Current Attention Level: Focused Memory: Decreased recall of precautions;Decreased short-term memory Following Commands: Follows one step commands consistently Safety/Judgement: Decreased awareness of safety;Decreased awareness of deficits Awareness: Intellectual Problem Solving: Slow processing;Requires verbal cues General Comments: Pt required extra time to answer questions.  Pt thought he was "in the air" but did not know he was in a hospital.       General Comments General comments (skin integrity, edema, etc.): pt with eyes closed for majority of session but does follow commands and opens eyes to cues. Slowed processing time. Pt with R gaze deviations, at best can track to midline. Pt with L inattention    Exercises     Assessment/Plan    PT Assessment Patient needs continued PT services  PT Problem List Decreased strength;Decreased balance;Decreased activity tolerance;Decreased mobility;Decreased cognition;Decreased knowledge of use of DME;Decreased safety awareness;Decreased knowledge of precautions;Cardiopulmonary status limiting activity;Impaired sensation       PT Treatment Interventions DME instruction;Gait training;Stair training;Functional mobility training;Balance  training;Therapeutic activities;Therapeutic exercise;Neuromuscular re-education;Cognitive remediation;Patient/family education    PT Goals (Current goals can be found in the Care Plan section)  Acute Rehab PT Goals Patient Stated Goal: none stated, PT goal to reduce falls risk PT Goal Formulation: With patient Time For Goal Achievement: 07/27/19 Potential to Achieve Goals: Good    Frequency Min 4X/week   Barriers to discharge        Co-evaluation PT/OT/SLP Co-Evaluation/Treatment: Yes Reason for Co-Treatment: Complexity of the patient's impairments (multi-system involvement);Necessary to address cognition/behavior during functional activity;For patient/therapist safety;To address functional/ADL transfers PT goals addressed during session: Mobility/safety with mobility;Balance;Strengthening/ROM OT goals addressed during session: ADL's and self-care       AM-PAC PT "6 Clicks" Mobility  Outcome Measure Help needed turning from your back to your side while in a flat bed without using bedrails?: Total Help needed moving from lying on your back to sitting on the side of a flat bed without using bedrails?: Total Help needed moving to and from a bed to a chair (including a wheelchair)?: Total Help needed standing up from a chair using your arms (e.g., wheelchair or bedside chair)?: Total Help needed to walk in hospital room?: Total Help needed climbing 3-5 steps with a railing? : Total 6 Click Score: 6    End of Session Equipment Utilized During Treatment: Oxygen Activity Tolerance: Patient limited by lethargy Patient left: in chair;with call bell/phone within reach;with chair alarm set Nurse Communication: Mobility status PT Visit Diagnosis: Other abnormalities of gait and mobility (R26.89);Muscle weakness (generalized) (M62.81);Other symptoms and signs involving the nervous system (L84.536)    Time: 4680-3212 PT Time Calculation (min) (ACUTE ONLY): 23 min  Charges:   PT  Evaluation $PT Eval Moderate Complexity: 1 Mod          Arlyss Gandy, PT, DPT Acute Rehabilitation Pager: [phone]   Arlyss Gandy 07/13/2019, 9:44 AM

## 2019-07-13 NOTE — Progress Notes (Signed)
  Speech Language Pathology Treatment: Dysphagia;Cognitive-Linquistic  Patient Details Name: Abdurahman Rugg MRN: [id number] DOB: 07-22-1960 Today's Date: 07/13/2019 Time: 0915-0940 SLP Time Calculation (min) (ACUTE ONLY): 25 min  Assessment / Plan / Recommendation Clinical Impression  Pt demonstrates improved arousal from yesterday; seen up in chair after PT session. With verbal/auditory stimulation and tactile cueing given at a constant rate every few seconds, pt was able to sustain arousal to functional tasks, follow one step commands track items to midline, self feed with a cup and a spoon. He exhibited mild anterior spillage with thin liquids and oral residue with pudding and honey thick liquids. A verbal cue to clear oral cavity was successful to elicit a second swallow. Pt somewhat impulsive with liquids and when trying to take consecutive sips, had immediate cough with honey thick liquids. A verbal cue and assist with cup successfully limited bolus rate and eliminated signs of aspiration. Hopeful that arousal will generally improve. Will initiate a Dys 1 (puree) diet with honey thick liquids for lunch. If pt cannot stay alert to eat and drink meals today, amy want to consider a Cortrak as nutrition has been minimal over the past three days.   HPI HPI:  59 y.o. man w/ acute onset headache with left sided weakness. CTH  shows right basal ganglia ICH measuring 5.4x2.9x4.0cm with 5.50mm of midline shift, volume estimated at 32-38cc.      SLP Plan  Continue with current plan of care       Recommendations  Diet recommendations: Dysphagia 1 (puree);Honey-thick liquid Liquids provided via: Cup Medication Administration: Whole meds with puree Supervision: Staff to assist with self feeding;Full supervision/cueing for compensatory strategies Compensations: Slow rate;Small sips/bites Postural Changes and/or Swallow Maneuvers: Seated upright 90 degrees                General recommendations: Rehab  consult Oral Care Recommendations: Oral care QID Follow up Recommendations: Inpatient Rehab SLP Visit Diagnosis: Cognitive communication deficit (W09.811) Plan: Continue with current plan of care       GO               Harlon Ditty, MA CCC-SLP  Acute Rehabilitation Services Pager [phone] Office [phone]  [name] 07/13/2019, 10:44 AM

## 2019-07-13 NOTE — Progress Notes (Signed)
STROKE TEAM PROGRESS NOTE   INTERVAL HISTORY Patient appears more alert and interactive today and does follow commands.  Serum sodium was 162 this a.m. and hypertonic saline drip as been stopped serum potassium is low at 3.2 no family at the bedside.  Vital signs stable.  Blood pressure adequately controlled No neurological changes   Vitals:   07/13/19 1445 07/13/19 1500 07/13/19 1515 07/13/19 1523  BP: 126/77   (!) 175/88  Pulse: 95 (!) 103 (!) 103 (!) 121  Resp: (!) 24 (!) 26 (!) 27 (!) 32  Temp:      TempSrc:      SpO2: 99% 100% 98% 99%  Weight:      Height:        CBC:  Recent Labs  Lab 07/10/19 1006 07/10/19 1008 07/12/19 0541 07/13/19 0635  WBC 11.0*   < > 10.3 9.8  NEUTROABS 6.5  --   --   --   HGB 14.7   < > 13.8 14.0  HCT 44.9   < > 41.0 42.0  MCV 63.5*   < > 62.2* 62.4*  PLT 315   < > 295 273   < > = values in this interval not displayed.    Basic Metabolic Panel:  Recent Labs  Lab 07/12/19 0541 07/12/19 1001 07/13/19 0104 07/13/19 0635  NA 149*   < > 162* 162*  K 3.5  --   --  3.2*  CL 117*  --   --  126*  CO2 21*  --   --  25  GLUCOSE 141*  --   --  127*  BUN 8  --   --  9  CREATININE 1.15  --   --  1.49*  CALCIUM 8.7*  --   --  9.2   < > = values in this interval not displayed.   Lipid Panel:     Component Value Date/Time   CHOL 198 07/10/2019 1256   TRIG 124 07/10/2019 1256   HDL 56 07/10/2019 1256   CHOLHDL 3.5 07/10/2019 1256   VLDL 25 07/10/2019 1256   LDLCALC 117 (H) 07/10/2019 1256   HgbA1c:  Lab Results  Component Value Date   HGBA1C 5.1 07/10/2019   Urine Drug Screen:     Component Value Date/Time   LABOPIA NONE DETECTED 07/10/2019 1800   COCAINSCRNUR NONE DETECTED 07/10/2019 1800   LABBENZ NONE DETECTED 07/10/2019 1800   AMPHETMU NONE DETECTED 07/10/2019 1800   THCU NONE DETECTED 07/10/2019 1800   LABBARB NONE DETECTED 07/10/2019 1800    Alcohol Level     Component Value Date/Time   ETH <10 07/10/2019 1256     IMAGING past 24 hours No results found.  PHYSICAL EXAM   Pleasant middle-aged Asian male not in distress. . Afebrile. Head is nontraumatic. Neck is supple without bruit.    Cardiac exam no murmur or gallop. Lungs are clear to auscultation. Distal pulses are well felt. Neurological Exam :  Drowsy but can be aroused to follow simple commands.  He has diminished attention, registration and recall.  Right gaze preference but able to look to the left past midline.  Speech is nonfluent but does speak few words and short sentences.  Follows simple midline and one-step commands.  Left lower facial weakness.  Dense left hemiplegia with only trace withdrawal in the lower extremity to pain and none in the upper extremity.  Tone is reduced on the left with flaccidity.  Sensation is preserved on the right  and reduced on the left.  Right plantar downgoing left upgoing.  Gait not tested.  ASSESSMENT/PLAN Jonathan Flowers is a 59 y.o. male with history of HTN presenting with R gaze deviation and L sided flacidity. In ED developed less responsive state, HTN emergency and urine incontinence.   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   CT head unchanged R basal ganglia ICH w/ 20mm shift.   CTA head no LVO. Distal L A2 moderate/severe stenosis. Distal R A2 moderate stenosis.   MRI  Stable R basal ganglia ICH. L periatrial white matter infarct. Multiple scattered microhemorrhages w/ old L temporal lobe ICH. Small vessel disease. Atrophy.   Repeat CT head in am unchanged R basal ganglia hemorrhage, increased edema. Mass effect and 26mm midline shift same. Small L parietal white matter infarct. Sinus dz.   Carotid Doppler pending  2D Echo EF  55-60%. No source of embolus    LDL 117  HgbA1c 5.1  SCDs for VTE prophylaxis  No antithrombotic prior to admission, now on No antithrombotic given hemorrhage   Therapy recommendations:  pending   Disposition:  pending   Keep in ICU today  Keep in bed today  Cytotoxic Cerebral Edema Induced Hypernatremia   CT/MRI w/ 71mm midline shift, unchanged  Neursurgery consult  (Ostergard) - following, likely will not need evacuation  Started on hypertonic saline via PIV  Na 141-143-146-141-149-152  Goal Na 150-155  Continue 3% @ 75/hr via PIV  Anticipate stopping tomorrow  Hypertensive Emergency  BP as high as 206/109  Home meds:  none  Treated w/ cleviprex, prn labetalol 20-40 and hydralazine 10 q6h . Increase SBP goal < 160 . On norvasc 5, add HCTZ 25 . Long-term BP goal normotensive  Hyperlipidemia  Home meds:  No statin  LDL 117, goal < 70 for ischemic infarct   Hold statin in setting of ICH  Dysphagia . Secondary to stroke . NPO x meds  . Speech on board   Other Stroke Risk Factors  Overweight, Body mass index is 27.02 kg/m., recommend weight loss, diet and exercise as appropriate   Other Active Problems  AKI Cre 1.34->1.20->1.15 - resolved  Leukocytosis 11.0->10.3 - resolved  Hospital day # 3  Continue close neurological monitoring and strict blood pressure control and discontinue hypertonic saline drip use supratherapeutic sodium.  Wean off Cardene drip and use IV hydralazine as needed and use losartan and hydrochlorothiazide for hypertension.  Speech therapy did swallow eval numbers recommend dysphagia 1 diet with pure honey thick liquid l.  He remains at risk for neurological worsening and development of hydrocephalus and increased cerebral edema.  No family available at the bedside for discussion.This patient is critically ill and at significant risk of neurological worsening, death and care requires constant monitoring of vital signs,  hemodynamics,respiratory and cardiac monitoring, extensive review of multiple databases, frequent neurological assessment, discussion with family, other specialists and medical decision making of high complexity.I have made any additions or clarifications directly to the above note.This critical care time does not reflect procedure time, or teaching time or supervisory time of PA/NP/Med Resident etc but could involve care discussion time.  I spent 30 minutes of neurocritical care time  in the care of  this patient.       Antony Contras, MD  To contact Stroke Continuity provider, please refer to [email or web address] After hours, contact General Neurology

## 2019-07-14 ENCOUNTER — Inpatient Hospital Stay (HOSPITAL_COMMUNITY): Payer: 59

## 2019-07-14 LAB — LACTIC ACID, PLASMA: Lactic Acid, Venous: 2.1 mmol/L (ref 0.5–1.9)

## 2019-07-14 LAB — BASIC METABOLIC PANEL
Anion gap: 14 (ref 5–15)
BUN: 21 mg/dL — ABNORMAL HIGH (ref 6–20)
CO2: 24 mmol/L (ref 22–32)
Calcium: 9.2 mg/dL (ref 8.9–10.3)
Chloride: 126 mmol/L — ABNORMAL HIGH (ref 98–111)
Creatinine, Ser: 1.86 mg/dL — ABNORMAL HIGH (ref 0.61–1.24)
GFR calc Af Amer: 46 mL/min — ABNORMAL LOW (ref >60)
GFR calc non Af Amer: 40 mL/min — ABNORMAL LOW (ref >60)
Glucose, Bld: 113 mg/dL — ABNORMAL HIGH (ref 70–99)
Potassium: 3.6 mmol/L (ref 3.5–5.1)
Sodium: 164 mmol/L (ref 135–145)

## 2019-07-14 LAB — CBC
HCT: 43.6 % (ref 39.0–52.0)
HCT: 45.5 % (ref 39.0–52.0)
Hemoglobin: 14.6 g/dL (ref 13.0–17.0)
Hemoglobin: 14.7 g/dL (ref 13.0–17.0)
MCH: 20.9 pg — ABNORMAL LOW (ref 26.0–34.0)
MCH: 20.5 pg — ABNORMAL LOW (ref 26.0–34.0)
MCHC: 33.5 g/dL (ref 30.0–36.0)
MCHC: 32.3 g/dL (ref 30.0–36.0)
MCV: 62.6 fL — ABNORMAL LOW (ref 80.0–100.0)
MCV: 63.4 fL — ABNORMAL LOW (ref 80.0–100.0)
Platelets: 241 10*3/uL (ref 150–400)
Platelets: 216 10*3/uL (ref 150–400)
RBC: 6.97 MIL/uL — ABNORMAL HIGH (ref 4.22–5.81)
RBC: 7.18 MIL/uL — ABNORMAL HIGH (ref 4.22–5.81)
RDW: 19 % — ABNORMAL HIGH (ref 11.5–15.5)
RDW: 19.4 % — ABNORMAL HIGH (ref 11.5–15.5)
WBC: 12.2 10*3/uL — ABNORMAL HIGH (ref 4.0–10.5)
WBC: 17.3 10*3/uL — ABNORMAL HIGH (ref 4.0–10.5)
nRBC: 0 % (ref 0.0–0.2)
nRBC: 0.1 % (ref 0.0–0.2)

## 2019-07-14 MED ORDER — VANCOMYCIN HCL IN DEXTROSE 1-5 GM/200ML-% IV SOLN: 1000.0000 mg | INTRAVENOUS | Status: DC

## 2019-07-14 MED ORDER — PIPERACILLIN-TAZOBACTAM 3.375 G IVPB
3.3750 g | Freq: Three times a day (TID) | INTRAVENOUS | Status: DC
  Administered 2019-07-15 – 2019-07-17 (×7): 3.375 g via INTRAVENOUS
  Filled 2019-07-14 (×6): qty 50

## 2019-07-14 MED ORDER — POLYETHYLENE GLYCOL 3350 17 G PO PACK
17.0000 g | PACK | Freq: Every day | ORAL | Status: DC
  Administered 2019-07-16 – 2019-07-21 (×5): 17 g via ORAL
  Filled 2019-07-14 (×5): qty 1

## 2019-07-14 MED ORDER — VANCOMYCIN HCL 1500 MG/300ML IV SOLN
1500.0000 mg | Freq: Once | INTRAVENOUS | Status: AC
  Administered 2019-07-14: 1500 mg via INTRAVENOUS
  Filled 2019-07-14: qty 300

## 2019-07-14 MED ORDER — PIPERACILLIN-TAZOBACTAM 3.375 G IVPB 30 MIN
3.3750 g | Freq: Once | INTRAVENOUS | Status: AC
  Administered 2019-07-14: 3.375 g via INTRAVENOUS
  Filled 2019-07-14 (×2): qty 50

## 2019-07-14 MED ORDER — LORAZEPAM 2 MG/ML IJ SOLN: 2.0000 mg | Freq: Once | INTRAMUSCULAR | Status: DC

## 2019-07-14 MED ORDER — DEXTROSE 5 % IV SOLN: INTRAVENOUS | Status: DC

## 2019-07-14 MED ORDER — ENOXAPARIN SODIUM 40 MG/0.4ML ~~LOC~~ SOLN
40.0000 mg | SUBCUTANEOUS | Status: DC
  Administered 2019-07-14 – 2019-07-21 (×8): 40 mg via SUBCUTANEOUS
  Filled 2019-07-14 (×8): qty 0.4

## 2019-07-14 MED ORDER — LORAZEPAM 2 MG/ML IJ SOLN
INTRAMUSCULAR | Status: AC
  Filled 2019-07-14: qty 1

## 2019-07-14 NOTE — Progress Notes (Signed)
STROKE TEAM PROGRESS NOTE   INTERVAL HISTORY Patient appears a little drowsy today but can be aroused today and does follow commands.  Serum sodium was 164 this a.m. and hypertonic saline drip as been stopped serum potassium is 3/6 no family at the bedside.  Vital signs stable.  Blood pressure adequately controlled No neurological changes   Vitals:   07/14/19 0900 07/14/19 1000 07/14/19 1100 07/14/19 1200  BP: 127/87 (!) 146/84 129/80 139/82  Pulse: (!) 112  (!) 112 (!) 110  Resp: (!) 28 (!) 28 (!) 27 (!) 23  Temp:    99.1 F (37.3 C)  TempSrc:    Axillary  SpO2: 100%  93% 92%  Weight:      Height:        CBC:  Recent Labs  Lab 07/10/19 1006 07/10/19 1008 07/13/19 0635 07/14/19 0724  WBC 11.0*   < > 9.8 12.2*  NEUTROABS 6.5  --   --   --   HGB 14.7   < > 14.0 14.6  HCT 44.9   < > 42.0 43.6  MCV 63.5*   < > 62.4* 62.6*  PLT 315   < > 273 241   < > = values in this interval not displayed.    Basic Metabolic Panel:  Recent Labs  Lab 07/13/19 0635 07/14/19 0724  NA 162* 164*  K 3.2* 3.6  CL 126* 126*  CO2 25 24  GLUCOSE 127* 113*  BUN 9 21*  CREATININE 1.49* 1.86*  CALCIUM 9.2 9.2   Lipid Panel:     Component Value Date/Time   CHOL 198 07/10/2019 1256   TRIG 124 07/10/2019 1256   HDL 56 07/10/2019 1256   CHOLHDL 3.5 07/10/2019 1256   VLDL 25 07/10/2019 1256   LDLCALC 117 (H) 07/10/2019 1256   HgbA1c:  Lab Results  Component Value Date   HGBA1C 5.1 07/10/2019   Urine Drug Screen:     Component Value Date/Time   LABOPIA NONE DETECTED 07/10/2019 1800   COCAINSCRNUR NONE DETECTED 07/10/2019 1800   LABBENZ NONE DETECTED 07/10/2019 1800   AMPHETMU NONE DETECTED 07/10/2019 1800   THCU NONE DETECTED 07/10/2019 1800   LABBARB NONE DETECTED 07/10/2019 1800    Alcohol Level     Component Value Date/Time   ETH <10 07/10/2019 1256    IMAGING past 24 hours No results found.  PHYSICAL EXAM   Pleasant middle-aged Asian male not in distress. . Afebrile.  Head is nontraumatic. Neck is supple without bruit.    Cardiac exam no murmur or gallop. Lungs are clear to auscultation. Distal pulses are well felt. Neurological Exam :  Drowsy but can be aroused to follow simple commands.  He has diminished attention, registration and recall.  Right gaze preference but able to look to the left past midline.  Speech is nonfluent but does speak few words and short sentences.  Follows simple midline and one-step commands.  Left lower facial weakness.  Dense left hemiplegia with only trace withdrawal in the lower extremity to pain and none in the upper extremity.  Tone is reduced on the left with flaccidity.  Sensation is preserved on the right and reduced on the left.  Right plantar downgoing left upgoing.  Gait not tested.  ASSESSMENT/PLAN Jonathan Flowers is a 59 y.o. male with history of HTN presenting with R gaze deviation and L sided flacidity. In ED developed less responsive state, HTN emergency and urine incontinence.   Stroke:  R basal ganglia  ICH w/ chronic microhemorrhages with cytotoxic edema and right left midline shift and brain herniation Stroke: L MCA infarct, likely secondary to small vessel disease source in setting of uncontrolled HTN  Code Stroke CT head R basal ganglia ICH w/ mass effect and 38mm R midline shift.   CT head unchanged R basal ganglia ICH w/ 43mm shift.   CTA head no LVO. Distal L A2 moderate/severe stenosis. Distal R A2 moderate stenosis.   MRI  Stable R basal ganglia ICH. L periatrial white matter infarct. Multiple scattered microhemorrhages w/ old L temporal lobe ICH. Small vessel disease. Atrophy.   Repeat CT head in am unchanged R basal ganglia hemorrhage, increased edema. Mass effect and 58mm midline shift same. Small L parietal white matter infarct. Sinus dz.   Carotid Doppler pending  2D Echo EF 55-60%. No source of embolus    LDL 117  HgbA1c 5.1  SCDs for VTE prophylaxis  No antithrombotic prior to admission, now on  No antithrombotic given hemorrhage   Therapy recommendations:  pending   Disposition:  pending   Keep in ICU today  Keep in bed today  Cytotoxic Cerebral Edema Induced Hypernatremia   CT/MRI w/ 69mm midline shift, unchanged  Neursurgery consult  (Ostergard) - following, likely will not need evacuation  Started on hypertonic saline via PIV  Na 141-143-146-141-149-152  Goal Na 150-155  Continue 3% @ 75/hr via PIV  Anticipate stopping tomorrow  Hypertensive Emergency  BP as high as 206/109  Home meds:  none  Treated w/ cleviprex, prn labetalol 20-40 and hydralazine 10 q6h . Increase SBP goal < 160 . On norvasc 5, add HCTZ 25 . Long-term BP goal normotensive  Hyperlipidemia  Home meds:  No statin  LDL 117, goal < 70 for ischemic infarct   Hold statin in setting of ICH  Dysphagia . Secondary to stroke . NPO x meds  . Speech on board   Other Stroke Risk Factors  Overweight, Body mass index is 27.02 kg/m., recommend weight loss, diet and exercise as appropriate   Other Active Problems  AKI Cre 1.34->1.20->1.15 - resolved  Leukocytosis 11.0->10.3 - resolved  Hospital day # 4  Continue close neurological monitoring and strict blood pressure control and discontinue hypertonic saline drip use supratherapeutic sodium.  He is now of Cardene drip and on  IV hydralazine as needed and oral cozaar, amlodipine  and hydrochlorothiazide for hypertension.  Speech therapy has cleared him ford dysphagia 1 diet with pure honey thick liquid l.  He remains at risk for neurological worsening and development of hydrocephalus and increased cerebral edema.  No family available at the bedside for discussion.This patient is critically ill and at significant risk of neurological worsening, death and care requires constant monitoring of vital signs, hemodynamics,respiratory and cardiac monitoring, extensive review of multiple databases, frequent neurological assessment, discussion with  family, other specialists and medical decision making of high complexity.I have made any additions or clarifications directly to the above note.This critical care time does not reflect procedure time, or teaching time or supervisory time of PA/NP/Med Resident etc but could involve care discussion time.  I spent 30 minutes of neurocritical care time  in the care of  this patient.       Antony Contras, MD  To contact Stroke Continuity provider, please refer to [email or web address] After hours, contact General Neurology

## 2019-07-14 NOTE — Progress Notes (Signed)
EEG complete - results pending 

## 2019-07-14 NOTE — Progress Notes (Signed)
Pharmacy Antibiotic Note  Jonathan Flowers is a 59 Jonathan.o. male admitted on 07/10/2019 with stroke, now with concern for aspiration pneumonia and sepsis.  Pharmacy has been consulted for vancomycin and Zosyn dosing.  Plan: Vancomycin 1500mg  x1 then 1000mg  IV every 24 hours.  Goal trough 15-20 mcg/mL. Zosyn 3.375g IV every 8 hours (4-hour infusion).  Height: 5\' 4"  (162.6 cm) Weight: 71.4 kg (157 lb 6.5 oz) IBW/kg (Calculated) : 59.2  Temp (24hrs), Avg:100.5 F (38.1 C), Min:99.1 F (37.3 C), Max:102.4 F (39.1 C)  Recent Labs  Lab 07/10/19 1006 07/10/19 1008 07/12/19 0541 07/13/19 0635 07/14/19 0724 07/14/19 2047  WBC 11.0*  --  10.3 9.8 12.2* 17.3*  CREATININE 1.34* 1.20 1.15 1.49* 1.86*  --   LATICACIDVEN  --   --   --   --   --  2.1*    Estimated Creatinine Clearance: 40.2 mL/min (A) (by C-G formula based on SCr of 1.86 mg/dL (H)).    No Known Allergies   Thank you for allowing pharmacy to be a part of this patient's care.  09/12/19, PharmD, BCPS  07/14/2019 10:58 PM

## 2019-07-14 NOTE — Progress Notes (Signed)
  Speech Language Pathology Treatment: Dysphagia;Cognitive-Linquistic  Patient Details Name: Jonathan Flowers MRN: [id number] DOB: November 16, 1960 Today's Date: 07/14/2019 Time: 0842-0900 SLP Time Calculation (min) (ACUTE ONLY): 18 min  Assessment / Plan / Recommendation Clinical Impression  Pt seen in bed with am meal, still drowsy, but arouses easily, sustained attention to meal with decreased cueing today, fed him self, locating bowl visual at midline repeatedly for a 5 minute interval.  Located left hand with right with constant verbal and tactile guidance. Total fading to max assist for pt to lift his own hand off the bed with his right hand.  Tolerating PO texture with mild left buccal residue and anterior spillage. Occasional throat clearing during meal. Recommend pt continue current diet. Will f/u next week for possible diet advancement.   HPI HPI:  59 y.o. man w/ acute onset headache with left sided weakness. 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      SLP Plan  Continue with current plan of care       Recommendations  Diet recommendations: Dysphagia 1 (puree);Honey-thick liquid Liquids provided via: Cup;Teaspoon Medication Administration: Whole meds with puree Supervision: Staff to assist with self feeding;Full supervision/cueing for compensatory strategies Compensations: Slow rate;Small sips/bites Postural Changes and/or Swallow Maneuvers: Seated upright 90 degrees                General recommendations: Rehab consult Oral Care Recommendations: Oral care QID Follow up Recommendations: Inpatient Rehab SLP Visit Diagnosis: Cognitive communication deficit (B93.903) Plan: Continue with current plan of care       GO               Harlon Ditty, MA CCC-SLP  Acute Rehabilitation Services Pager [phone] Office [phone]  [name] 07/14/2019, 9:02 AM

## 2019-07-14 NOTE — Progress Notes (Signed)
PT Cancellation Note  Patient Details Name: Jonathan Flowers MRN: [id number] DOB: 1960-12-01   Cancelled Treatment:    Reason Eval/Treat Not Completed: Patient's level of consciousness. Pt lethargic and not able to participate in skilled PT intervention at this time. PT will attempt to follow up at a later time when the pt is more alert and better able to participate in session.   Arlyss Gandy 07/14/2019, 5:10 PM

## 2019-07-14 NOTE — Progress Notes (Signed)
Pt less responsive, seems aphasic with some rhythmic movement in R side with R sided nystagmus. Dr Pearlean Brownie paged and at bedside. STAT CT and EEG ordered. CT stable, EEG negative. Temp 101, treated with tylenol. No new orders at this time.

## 2019-07-15 LAB — BLOOD CULTURE ID PANEL (REFLEXED)

## 2019-07-15 LAB — EXPECTORATED SPUTUM ASSESSMENT W GRAM STAIN, RFLX TO RESP C

## 2019-07-15 LAB — BASIC METABOLIC PANEL
Anion gap: 8 (ref 5–15)
BUN: 37 mg/dL — ABNORMAL HIGH (ref 6–20)
CO2: 25 mmol/L (ref 22–32)
Calcium: 8.6 mg/dL — ABNORMAL LOW (ref 8.9–10.3)
Chloride: 126 mmol/L — ABNORMAL HIGH (ref 98–111)
Creatinine, Ser: 2.32 mg/dL — ABNORMAL HIGH (ref 0.61–1.24)
GFR calc Af Amer: 35 mL/min — ABNORMAL LOW (ref >60)
GFR calc non Af Amer: 30 mL/min — ABNORMAL LOW (ref >60)
Glucose, Bld: 148 mg/dL — ABNORMAL HIGH (ref 70–99)
Potassium: 3.2 mmol/L — ABNORMAL LOW (ref 3.5–5.1)
Sodium: 159 mmol/L — ABNORMAL HIGH (ref 135–145)

## 2019-07-15 LAB — URINALYSIS, ROUTINE W REFLEX MICROSCOPIC
Bilirubin Urine: NEGATIVE
Glucose, UA: NEGATIVE mg/dL
Ketones, ur: NEGATIVE mg/dL
Nitrite: NEGATIVE
Protein, ur: 30 mg/dL — AB
Specific Gravity, Urine: 1.021 (ref 1.005–1.030)
WBC, UA: >50 WBC/hpf — ABNORMAL HIGH (ref 0–5)
pH: 5 (ref 5.0–8.0)

## 2019-07-15 LAB — LACTIC ACID, PLASMA: Lactic Acid, Venous: 1.6 mmol/L (ref 0.5–1.9)

## 2019-07-15 MED ORDER — AMANTADINE HCL 100 MG PO CAPS
100.0000 mg | ORAL_CAPSULE | Freq: Two times a day (BID) | ORAL | Status: DC
  Administered 2019-07-15 – 2019-07-21 (×13): 100 mg via ORAL
  Filled 2019-07-15 (×14): qty 1

## 2019-07-15 MED ORDER — POTASSIUM CHLORIDE 10 MEQ/100ML IV SOLN
10.0000 meq | INTRAVENOUS | Status: AC
  Administered 2019-07-15 (×4): 10 meq via INTRAVENOUS
  Filled 2019-07-15 (×4): qty 100

## 2019-07-15 MED ORDER — POTASSIUM CHLORIDE CRYS ER 20 MEQ PO TBCR
20.0000 meq | EXTENDED_RELEASE_TABLET | ORAL | Status: AC
  Administered 2019-07-15 (×2): 20 meq via ORAL
  Filled 2019-07-15 (×2): qty 1

## 2019-07-15 NOTE — Progress Notes (Addendum)
STROKE TEAM PROGRESS NOTE   INTERVAL HISTORY Patient continues to be sleepy and e drowsy today but can be aroused today and does follow commands.  Serum sodium was 159 this a.m.  .  Vital signs stable.  Blood pressure adequately controlled No neurological changes he had a temperature spike yesterday and white count was elevated to 17.3.  He was pancultured.  Urine analysis shows UTI and has been started on Zosyn Vitals:   07/15/19 1000 07/15/19 1100 07/15/19 1200 07/15/19 1300  BP: 118/88 111/73 123/78 124/85  Pulse: 93 90 92 95  Resp: 20 (!) 27 (!) 23 17  Temp:   99.8 F (37.7 C)   TempSrc:   Axillary   SpO2: 96% 90% 96% 94%  Weight:      Height:        CBC:  Recent Labs  Lab 07/10/19 1006 07/10/19 1008 07/14/19 0724 07/14/19 2047  WBC 11.0*   < > 12.2* 17.3*  NEUTROABS 6.5  --   --   --   HGB 14.7   < > 14.6 14.7  HCT 44.9   < > 43.6 45.5  MCV 63.5*   < > 62.6* 63.4*  PLT 315   < > 241 216   < > = values in this interval not displayed.    Basic Metabolic Panel:  Recent Labs  Lab 07/14/19 0724 07/15/19 0627  NA 164* 159*  K 3.6 3.2*  CL 126* 126*  CO2 24 25  GLUCOSE 113* 148*  BUN 21* 37*  CREATININE 1.86* 2.32*  CALCIUM 9.2 8.6*   Lipid Panel:     Component Value Date/Time   CHOL 198 07/10/2019 1256   TRIG 124 07/10/2019 1256   HDL 56 07/10/2019 1256   CHOLHDL 3.5 07/10/2019 1256   VLDL 25 07/10/2019 1256   LDLCALC 117 (H) 07/10/2019 1256   HgbA1c:  Lab Results  Component Value Date   HGBA1C 5.1 07/10/2019   Urine Drug Screen:     Component Value Date/Time   LABOPIA NONE DETECTED 07/10/2019 1800   COCAINSCRNUR NONE DETECTED 07/10/2019 1800   LABBENZ NONE DETECTED 07/10/2019 1800   AMPHETMU NONE DETECTED 07/10/2019 1800   THCU NONE DETECTED 07/10/2019 1800   LABBARB NONE DETECTED 07/10/2019 1800    Alcohol Level     Component Value Date/Time   ETH <10 07/10/2019 1256    IMAGING past 24 hours DG CHEST PORT 1 VIEW  Result Date:  07/14/2019 CLINICAL DATA:  Stroke. EXAM: PORTABLE CHEST 1 VIEW COMPARISON:  07/10/2019 FINDINGS: Again noted are low lung volumes with cardiomegaly. There is vascular congestion with possible early interstitial edema. There is no pneumothorax or large pleural effusion. There is some atelectasis at the lung bases. There is no acute osseous abnormality. IMPRESSION: Cardiomegaly with vascular congestion and possible early interstitial edema. Electronically Signed   By: Katherine Mantle M.D.   On: 07/14/2019 20:41   EEG adult  Result Date: 07/15/2019 Charlsie Quest, MD     07/15/2019  8:42 AM Patient Name: Jonathan Flowers MRN: [id number] Epilepsy Attending: Charlsie Quest Referring Physician/Provider: Dr. Delia Heady Date: 07/14/2019 Duration: 23.50 minutes Patient history: 59 year old male with right basal ganglia ICH and cytotoxic edema as well as left MCA infarct.  EEG evaluate for seizures. Level of alertness: Lethargic AEDs during EEG study: None Technical aspects: This EEG study was done with scalp electrodes positioned according to the 10-20 International system of electrode placement. Electrical activity was acquired at a sampling  rate of 500Hz  and reviewed with a high frequency filter of 70Hz  and a low frequency filter of 1Hz . EEG data were recorded continuously and digitally stored. Description: EEG showed continuous generalized and lateralized right hemisphere 5 to 8 Hz theta-alpha activity slowing.  EEG was reactive to tactile stimulation. Hyperventilation and photic stimulation were not performed.   ABNORMALITY -Continuous slow, generalized and lateralized right hemisphere IMPRESSION: This study is suggestive of cortical dysfunction in right hemisphere likely secondary to underlying structural abnormality.  Additionally, there is evidence of moderate to severe diffuse encephalopathy, nonspecific to etiology.  No seizures or epileptiform discharges were seen throughout the recording. [place]   Pleasant middle-aged Asian male not in distress. . Afebrile. Head is nontraumatic. Neck is supple without bruit.    Cardiac exam no murmur or gallop. Lungs are clear to auscultation. Distal pulses are well felt. Neurological Exam :  Drowsy but can be aroused to follow simple commands.  He has diminished attention, registration and recall.  Right gaze preference but able to look to the left past midline.  Speech is nonfluent but does speak few words and short sentences.  Follows simple midline and one-step commands.  Left lower facial weakness.  Dense left hemiplegia with only trace withdrawal in the lower extremity to pain and none in the upper extremity.  Tone is reduced on the left with flaccidity.  Sensation is preserved on the right and reduced on the left.  Right plantar downgoing left upgoing.  Gait not tested.  ASSESSMENT/PLAN [name] is a 59 y.o. male with history of HTN presenting with R gaze deviation and L sided flacidity. In ED developed less responsive state, HTN emergency and urine incontinence.   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   CT head unchanged R basal ganglia ICH w/ 88mm shift.   CTA head no LVO. Distal L A2 moderate/severe stenosis. Distal R A2 moderate stenosis.   MRI  Stable R basal ganglia ICH. L periatrial white matter infarct. Multiple scattered microhemorrhages w/ old L temporal lobe ICH. Small vessel disease. Atrophy.   Repeat CT head in am unchanged R basal ganglia hemorrhage, increased edema. Mass effect and 2mm midline shift same. Small L parietal white matter infarct. Sinus dz.   2D Echo EF 55-60%. No source of embolus    LDL 117  HgbA1c 5.1  SCDs for VTE  prophylaxis  No antithrombotic prior to admission, now on No antithrombotic given hemorrhage   Therapy recommendations:  pending   Disposition:  pending   Keep in ICU today  Keep in bed today  Cytotoxic Cerebral Edema Induced Hypernatremia   CT/MRI w/ 58mm midline shift, unchanged  Neursurgery consult  (Ostergard) - following, likely will not need evacuation  Started on hypertonic saline via PIV  Na 141-143-146-141-149-152  Goal Na 150-155  Continue 3% @ 75/hr via PIV  Anticipate stopping tomorrow  Hypertensive Emergency  BP as high as 206/109  Home meds:  none  Treated w/ cleviprex, prn labetalol 20-40 and hydralazine 10 q6h . Increase SBP goal < 160 . On norvasc 5, add HCTZ 25 . Long-term BP goal normotensive  Hyperlipidemia  Home meds:  No statin  LDL 117, goal < 70 for ischemic infarct   Hold statin in setting of ICH  Dysphagia . Secondary to stroke . NPO x meds  . Speech on board   Other Stroke Risk Factors  Overweight, Body mass index is 27.02 kg/m., recommend weight loss, diet and exercise as appropriate   Other Active Problems  AKI Cre 1.34->1.20->1.15 - resolved  Leukocytosis 11.0->10.3 - resolved  UTI-Zosyn started 07/14/2019  Hospital day # 5  Continue Zosyn for his urinary tract infection and follow culture results.  Continue close neurological monitoring and strict blood pressure control check EEG for seizure activity.  Trial of amantadine for arousal. speech therapy has cleared him for dysphagia 1 diet with pure honey thick liquid l.  He remains at risk for neurological worsening and development of hydrocephalus and increased cerebral edema.  No family available at the bedside for discussion.This patient is critically ill and at significant risk of neurological worsening, death and care requires constant monitoring of vital signs, hemodynamics,respiratory and cardiac monitoring, extensive review of multiple databases, frequent  neurological assessment, discussion with family, other specialists and medical decision making of high complexity.I have made any additions or clarifications directly to the above note.This critical care time does not reflect procedure time, or teaching time or supervisory time of PA/NP/Med Resident etc but could involve care discussion time.  I spent 30 minutes of neurocritical care time  in the care of  this patient.       Delia Heady, MD  To contact Stroke Continuity provider, please refer to [email or web address]. After hours, contact General Neurology

## 2019-07-15 NOTE — Progress Notes (Addendum)
Notified brother of patient transferring to [facility] Room 14. He is on the way to visit and will notify wife.

## 2019-07-15 NOTE — Procedures (Addendum)
Patient Name: Jonathan Flowers  MRN: [id number]  Epilepsy Attending: Charlsie Quest  Referring Physician/Provider: Dr. Delia Heady Date: 07/14/2019 Duration: 23.50 minutes  Patient history: 59 year old male with right basal ganglia ICH and cytotoxic edema as well as left MCA infarct.  EEG evaluate for seizures.  Level of alertness: Lethargic  AEDs during EEG study: None  Technical aspects: This EEG study was done with scalp electrodes positioned according to the 10-20 International system of electrode placement. Electrical activity was acquired at a sampling rate of 500Hz  and reviewed with a high frequency filter of 70Hz  and a low frequency filter of 1Hz . EEG data were recorded continuously and digitally stored.   Description: EEG showed continuous generalized and lateralized right hemisphere 5 to 8 Hz theta-alpha activity slowing.  EEG was reactive to tactile stimulation. Hyperventilation and photic stimulation were not performed.     ABNORMALITY -Continuous slow, generalized and lateralized right hemisphere  IMPRESSION: This study is suggestive of cortical dysfunction in right hemisphere likely secondary to underlying structural abnormality.  Additionally, there is evidence of moderate to severe diffuse encephalopathy, nonspecific to etiology.  No seizures or epileptiform discharges were seen throughout the recording.    Jonathan Flowers 

## 2019-07-15 NOTE — Progress Notes (Addendum)
Dr Aroor to bedside to evaluate new onset temperature of 102 orally. Pt very lethargic, unchanged from earlier today. CT head stable/EEG negative. Per Dr Aroor, will pan culture (UA, blood cultures, sputum culture, CXR). Also verbal order to discontinue D5 at 50, which was requested to be changed by MD but was not done. Handoff of new orders given to [place], night shift RN.

## 2019-07-15 NOTE — Progress Notes (Signed)
PHARMACY - PHYSICIAN COMMUNICATION CRITICAL VALUE ALERT - BLOOD CULTURE IDENTIFICATION (BCID)  Jonathan Flowers is an 59 Jonathan.o. male who presented to [facility] on 07/10/2019 with a chief complaint of ICH  Assessment: 58 YOM pan-cultured on 6/10 afternoon due to fevers and AMS. Now with 1 of 4 blood cultures growing MRSE - likely representative of contamination. Urine and respiratory cultures are still pending.   Name of physician (or Provider) Contacted: Aroor  Current antibiotics: Zosyn  Changes to prescribed antibiotics recommended:  None - await further cultures results.   Results for orders placed or performed during the hospital encounter of 07/10/19  Blood Culture ID Panel (Reflexed) (Collected: 07/14/2019  8:53 PM)  Result Value Ref Range   Enterococcus species NOT DETECTED NOT DETECTED   Listeria monocytogenes NOT DETECTED NOT DETECTED   Staphylococcus species DETECTED (A) NOT DETECTED   Staphylococcus aureus (BCID) NOT DETECTED NOT DETECTED   Methicillin resistance DETECTED (A) NOT DETECTED   Streptococcus species NOT DETECTED NOT DETECTED   Streptococcus agalactiae NOT DETECTED NOT DETECTED   Streptococcus pneumoniae NOT DETECTED NOT DETECTED   Streptococcus pyogenes NOT DETECTED NOT DETECTED   Acinetobacter baumannii NOT DETECTED NOT DETECTED   Enterobacteriaceae species NOT DETECTED NOT DETECTED   Enterobacter cloacae complex NOT DETECTED NOT DETECTED   Escherichia coli NOT DETECTED NOT DETECTED   Klebsiella oxytoca NOT DETECTED NOT DETECTED   Klebsiella pneumoniae NOT DETECTED NOT DETECTED   Proteus species NOT DETECTED NOT DETECTED   Serratia marcescens NOT DETECTED NOT DETECTED   Haemophilus influenzae NOT DETECTED NOT DETECTED   Neisseria meningitidis NOT DETECTED NOT DETECTED   Pseudomonas aeruginosa NOT DETECTED NOT DETECTED   Candida albicans NOT DETECTED NOT DETECTED   Candida glabrata NOT DETECTED NOT DETECTED   Candida krusei NOT DETECTED NOT DETECTED   Candida  parapsilosis NOT DETECTED NOT DETECTED   Candida tropicalis NOT DETECTED NOT DETECTED    Thank you for allowing pharmacy to be a part of this patient's care.  Georgina Pillion, PharmD, BCPS Clinical Pharmacist Clinical phone for 07/15/2019: [phone] 07/15/2019 9:17 PM   **Pharmacist phone directory can now be found on [email or web address] ([email or web address]).  Listed under [facility].

## 2019-07-15 NOTE — Progress Notes (Signed)
Physical Therapy Treatment Patient Details Name: Jonathan Flowers MRN: [id number] DOB: [birth year] Today's Date: 07/15/2019    History of Present Illness Jonathan Flowers is an 59 y.o. male with a PMHx of HTN who presented to the [facility] ED as a code stroke for acute onset of right gaze deviation and left sided flaccid weakness. 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 Surrounding edema. Mass effect with right-to-left shift of 6 mm. Pt becomes more lethargic on 6/10, found to have UTI    PT Comments    Pt is lethargic during session but does follow commands and awaken with verbal or tactile stimuli. Pt continues to demonstrate significant weakness, no AROM noted in left side with left inattention. Pt continues to demonstrate left lateral lean, pushing is less significant this session as pt desiring to hold PT's hand and not push through surface. Pt will continued to benefit from acute PT POC to improve mobility quality and to reduce caregiver burden. PT hopeful that pt will become more alert with medical management for UTI and be better able to tolerate increased activity. PT continues to recommend CIR at this time.  Follow Up Recommendations  CIR     Equipment Recommendations  Wheelchair (measurements PT);Wheelchair cushion (measurements PT);Hospital bed    Recommendations for Other Services       Precautions / Restrictions Precautions Precautions: Fall Restrictions Weight Bearing Restrictions: No Other Position/Activity Restrictions: pushes to left    Mobility  Bed Mobility Overal bed mobility: Needs Assistance Bed Mobility: Supine to Sit     Supine to sit: Max assist;HOB elevated        Transfers Overall transfer level: Needs assistance Equipment used: 1 person hand held assist Transfers: Squat Pivot Transfers     Squat pivot transfers: Max assist (drop arm recliner)     General transfer comment: PT provides L knee  block  Ambulation/Gait                 Stairs             Wheelchair Mobility    Modified Rankin (Stroke Patients Only) Modified Rankin (Stroke Patients Only) Pre-Morbid Rankin Score: No symptoms Modified Rankin: Severe disability     Balance Overall balance assessment: Needs assistance Sitting-balance support: Single extremity supported;Feet supported Sitting balance-Leahy Scale: Zero Sitting balance - Comments: maxA due to L lateral lean, pushing not as significant this session as pt electing to hold PT hand with RUE Postural control: Left lateral lean Standing balance support: Bilateral upper extremity supported Standing balance-Leahy Scale: Zero Standing balance comment: maxA to maintain squat during squat pivot transfer                            Cognition Arousal/Alertness: Lethargic Behavior During Therapy: Flat affect Overall Cognitive Status: Impaired/Different from baseline Area of Impairment: Orientation;Attention;Memory;Following commands;Safety/judgement;Awareness;Problem solving                 Orientation Level: Time Current Attention Level: Focused Memory: Decreased recall of precautions;Decreased short-term memory Following Commands: Follows one step commands with increased time Safety/Judgement: Decreased awareness of safety;Decreased awareness of deficits Awareness: Intellectual Problem Solving: Slow processing;Requires verbal cues        Exercises      General Comments General comments (skin integrity, edema, etc.): pt with eyes closed for majority of session but does open R eye with cues, noted to have R gaze deviation. Pt  on 2L [place] during session      Pertinent Vitals/Pain Pain Assessment: Faces Faces Pain Scale: Hurts even more Pain Location: R trap Pain Descriptors / Indicators: Grimacing Pain Intervention(s): Monitored during session    Home Living                      Prior Function             PT Goals (current goals can now be found in the care plan section) Acute Rehab PT Goals Patient Stated Goal: none stated, PT goal to reduce falls risk Progress towards PT goals: Not progressing toward goals - comment (limited by lethargy, new found UTI)    Frequency    Min 4X/week      PT Plan Current plan remains appropriate    Co-evaluation              AM-PAC PT "6 Clicks" Mobility   Outcome Measure  Help needed turning from your back to your side while in a flat bed without using bedrails?: Total Help needed moving from lying on your back to sitting on the side of a flat bed without using bedrails?: Total Help needed moving to and from a bed to a chair (including a wheelchair)?: Total Help needed standing up from a chair using your arms (e.g., wheelchair or bedside chair)?: Total Help needed to walk in hospital room?: Total Help needed climbing 3-5 steps with a railing? : Total 6 Click Score: 6    End of Session Equipment Utilized During Treatment: Oxygen Activity Tolerance: Patient limited by lethargy Patient left: in chair;with call bell/phone within reach;with chair alarm set Nurse Communication: Mobility status PT Visit Diagnosis: Other abnormalities of gait and mobility (R26.89);Muscle weakness (generalized) (M62.81);Other symptoms and signs involving the nervous system (Q19.758)     Time: 8325-4982 PT Time Calculation (min) (ACUTE ONLY): 23 min  Charges:  $Therapeutic Activity: 23-37 mins                     Arlyss Gandy, PT, DPT Acute Rehabilitation Pager: [phone]    Arlyss Gandy 07/15/2019, 9:31 AM

## 2019-07-16 ENCOUNTER — Encounter (HOSPITAL_COMMUNITY): Payer: Self-pay | Admitting: Neurology

## 2019-07-16 LAB — CBC
HCT: 41.4 % (ref 39.0–52.0)
Hemoglobin: 13.4 g/dL (ref 13.0–17.0)
MCH: 20.6 pg — ABNORMAL LOW (ref 26.0–34.0)
MCHC: 32.4 g/dL (ref 30.0–36.0)
MCV: 63.8 fL — ABNORMAL LOW (ref 80.0–100.0)
Platelets: 164 10*3/uL (ref 150–400)
RBC: 6.49 MIL/uL — ABNORMAL HIGH (ref 4.22–5.81)
RDW: 18.6 % — ABNORMAL HIGH (ref 11.5–15.5)
WBC: 15.3 10*3/uL — ABNORMAL HIGH (ref 4.0–10.5)
nRBC: 0 % (ref 0.0–0.2)

## 2019-07-16 LAB — SODIUM: Sodium: 157 mmol/L — ABNORMAL HIGH (ref 135–145)

## 2019-07-16 LAB — BASIC METABOLIC PANEL
Anion gap: 15 (ref 5–15)
BUN: 53 mg/dL — ABNORMAL HIGH (ref 6–20)
CO2: 23 mmol/L (ref 22–32)
Calcium: 8.8 mg/dL — ABNORMAL LOW (ref 8.9–10.3)
Chloride: 122 mmol/L — ABNORMAL HIGH (ref 98–111)
Creatinine, Ser: 2.55 mg/dL — ABNORMAL HIGH (ref 0.61–1.24)
GFR calc Af Amer: 31 mL/min — ABNORMAL LOW (ref >60)
GFR calc non Af Amer: 27 mL/min — ABNORMAL LOW (ref >60)
Glucose, Bld: 124 mg/dL — ABNORMAL HIGH (ref 70–99)
Potassium: 3.7 mmol/L (ref 3.5–5.1)
Sodium: 160 mmol/L — ABNORMAL HIGH (ref 135–145)

## 2019-07-16 MED ORDER — DEXTROSE-NACL 5-0.45 % IV SOLN: INTRAVENOUS | Status: DC

## 2019-07-16 MED ORDER — PANTOPRAZOLE SODIUM 40 MG PO TBEC
40.0000 mg | DELAYED_RELEASE_TABLET | Freq: Every day | ORAL | Status: DC
  Administered 2019-07-16 – 2019-07-21 (×6): 40 mg via ORAL
  Filled 2019-07-16 (×6): qty 1

## 2019-07-16 MED ORDER — BISACODYL 10 MG RE SUPP
10.0000 mg | Freq: Once | RECTAL | Status: AC
  Administered 2019-07-16: 10 mg via RECTAL
  Filled 2019-07-16: qty 1

## 2019-07-16 NOTE — Plan of Care (Signed)
Discussed with patient in front of wife and son plan of care for the evening, pain management and feeding with some teach back displayed

## 2019-07-16 NOTE — Progress Notes (Signed)
STROKE TEAM PROGRESS NOTE   INTERVAL HISTORY   Patient in no acute distress, opens eyes to voice, and does follow commands.  Serum sodium was 160this a.m.  .  Vital signs stable.  Blood pressure adequately controlled. Urine analysis showed UTI and has been started on Zosyn.WBCs improved today.   Vitals:   07/16/19 0730 07/16/19 0800 07/16/19 1001 07/16/19 1215  BP: 139/83 139/82 134/82 129/85  Pulse: 85 86 87 93  Resp: (!) 22 (!) 23 20 (!) 21  Temp:   97.9 F (36.6 C) 99.4 F (37.4 C)  TempSrc:   Oral Oral  SpO2: 95% 92% 96% 95%  Weight:      Height:        CBC:  Recent Labs  Lab 07/10/19 1006 07/10/19 1008 07/14/19 2047 07/16/19 0357  WBC 11.0*   < > 17.3* 15.3*  NEUTROABS 6.5  --   --   --   HGB 14.7   < > 14.7 13.4  HCT 44.9   < > 45.5 41.4  MCV 63.5*   < > 63.4* 63.8*  PLT 315   < > 216 164   < > = values in this interval not displayed.    Basic Metabolic Panel:  Recent Labs  Lab 07/15/19 0627 07/16/19 0357  NA 159* 160*  K 3.2* 3.7  CL 126* 122*  CO2 25 23  GLUCOSE 148* 124*  BUN 37* 53*  CREATININE 2.32* 2.55*  CALCIUM 8.6* 8.8*   Lipid Panel:     Component Value Date/Time   CHOL 198 07/10/2019 1256   TRIG 124 07/10/2019 1256   HDL 56 07/10/2019 1256   CHOLHDL 3.5 07/10/2019 1256   VLDL 25 07/10/2019 1256   LDLCALC 117 (H) 07/10/2019 1256   HgbA1c:  Lab Results  Component Value Date   HGBA1C 5.1 07/10/2019   Urine Drug Screen:     Component Value Date/Time   LABOPIA NONE DETECTED 07/10/2019 1800   COCAINSCRNUR NONE DETECTED 07/10/2019 1800   LABBENZ NONE DETECTED 07/10/2019 1800   AMPHETMU NONE DETECTED 07/10/2019 1800   THCU NONE DETECTED 07/10/2019 1800   LABBARB NONE DETECTED 07/10/2019 1800    Alcohol Level     Component Value Date/Time   ETH <10 07/10/2019 1256    IMAGING past 24 hours No results found.   EEG 07/15/19 ABNORMALITY  -Continuous slow, generalized and lateralized right hemisphere  IMPRESSION:  This study  is suggestive of cortical dysfunction in right  hemisphere likely secondary to underlying structural abnormality.  Additionally, there is evidence of moderate to severe diffuse  encephalopathy, nonspecific to etiology. No seizures or  epileptiform discharges were seen throughout the recording.   PHYSICAL EXAM   Pleasant middle-aged Asian male not in distress. Head is nontraumatic. Neck is supple without bruit.    Cardiac exam RRR no murmur or gallop. Lungs are clear to auscultation. Distal pulses are well felt. Neurological Exam :  Opens eyes to voice and can follow simple commands.  Oriented to self, month, year, "[place]", left-sided neglect, He has diminished attention but can follow commands with visual cues.  Right gaze preference does not cross miidline.  Speech is nonfluent but does speak few words and short sentences and answers simple questions.  Follows simple midline and one-step commands.  Left lower facial weakness.  Dense left hemiplegia with only trace withdrawal in the lower extremity to pain and none in the upper extremity.  Tone is reduced on the left with flaccidity, does  not appear to be developing increased tone or spasticity at this time.  Sensation is preserved on the right and reduced on the left.  Right plantar downgoing left upgoing.  Gait not tested.  ASSESSMENT/PLAN Jonathan Flowers is a 59 y.o. male with history of HTN presenting with R gaze deviation and L sided flacidity. In ED developed less responsive state, HTN emergency and urine incontinence.   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   CT head unchanged R basal ganglia ICH w/ 27mm shift.   CTA head no LVO. Distal L A2 moderate/severe stenosis.  Distal R A2 moderate stenosis.   MRI 67 Stable R basal ganglia ICH. L periatrial white matter infarct. Multiple scattered microhemorrhages w/ old L temporal lobe ICH. Small vessel disease. Atrophy.   Repeat CT head 6/10 unchanged R basal ganglia hemorrhage, increased edema. Mass effect and 45mm midline shift same. Small L parietal white matter infarct. Sinus dz.   2D Echo EF 55-60%. No source of embolus    EEG - 07/15/19 - This study is suggestive of cortical dysfunction in right hemisphere likely secondary to underlying structural abnormality. Additionally, there is evidence of moderate to severe diffuse encephalopathy, nonspecific to etiology. No seizures or epileptiform discharges were seen throughout the recording.   LDL 117  HgbA1c 5.1  SCDs for VTE prophylaxis  No antithrombotic prior to admission, now on No antithrombotic given hemorrhage   Therapy recommendations:  CIR recommended  Disposition:  pending    Cytotoxic Cerebral Edema Induced Hypernatremia   CT/MRI w/ 5mm midline shift, unchanged  Neursurgery consult  (Ostergard) - following, likely will not need evacuation  Started on hypertonic saline via PIV  Na 141-143-146-141-149-152....159->160 (current IV D5% at 50 cc / hr -> change to D5 1/2 NS at 100 cc /hr)  Goal Na 150-155  Continue 3% @ 75/hr via PIV -> off   Hypertensive Emergency  BP as high as 206/109  Home meds:  none  Treated w/ cleviprex, prn labetalol 20-40 and hydralazine 10 q6h . Increase SBP goal < 160 . On norvasc 5, add HCTZ 25 . Long-term BP goal normotensive . Monitor BP closely (now 129/85)  Hyperlipidemia  Home meds:  No statin  LDL 117, goal < 70 for ischemic infarct   Hold statin in setting of ICH  Dysphagia . Secondary to stroke . NPO x meds  . Speech on board   Other Stroke Risk Factors  Overweight, Body mass index is 27.02 kg/m., recommend weight loss, diet and exercise as appropriate   Other Active  Problems  AKI Cre 1.34->1.20->1.15......2.32->2.55 (current IV D5% at 50 cc / hr -> change to D5 1/2 NS at 100 cc /hr) (D/C HCTZ)  Leukocytosis 11.0->10.3 ......17.3->15.3 (temp 97.9)  UTI-Zosyn started 07/14/2019  Check labs in AM - recheck na in 6 hours  Lethargy -> amantadine trial started 6/11  Hospital day # 6   Personally examined patient and images, and have participated in and made any corrections needed to history, physical, neuro exam,assessment and plan as stated above.  I have personally obtained the history, evaluated lab date, reviewed imaging studies and agree with radiology interpretations.    Naomie Dean, MD Stroke Neurology   A total of  25 minutes was spent for the medical and neurologic care of this patient, spent on counseling patient and family on different diagnostic and therapeutic options, counseling and coordination of care, riskd ans benefits of management, documentation, compliance, or risk factor reduction and education. .         To contact Stroke Continuity provider, please refer to [email or web address] After hours, contact General Neurology

## 2019-07-17 ENCOUNTER — Inpatient Hospital Stay (HOSPITAL_COMMUNITY): Payer: 59

## 2019-07-17 DIAGNOSIS — R1312 Dysphagia, oropharyngeal phase: Secondary | ICD-10-CM

## 2019-07-17 DIAGNOSIS — I63312 Cerebral infarction due to thrombosis of left middle cerebral artery: Secondary | ICD-10-CM

## 2019-07-17 DIAGNOSIS — I161 Hypertensive emergency: Secondary | ICD-10-CM

## 2019-07-17 LAB — BASIC METABOLIC PANEL
Anion gap: 12 (ref 5–15)
BUN: 40 mg/dL — ABNORMAL HIGH (ref 6–20)
CO2: 24 mmol/L (ref 22–32)
Calcium: 8.7 mg/dL — ABNORMAL LOW (ref 8.9–10.3)
Chloride: 119 mmol/L — ABNORMAL HIGH (ref 98–111)
Creatinine, Ser: 2.3 mg/dL — ABNORMAL HIGH (ref 0.61–1.24)
GFR calc Af Amer: 35 mL/min — ABNORMAL LOW (ref >60)
GFR calc non Af Amer: 31 mL/min — ABNORMAL LOW (ref >60)
Glucose, Bld: 133 mg/dL — ABNORMAL HIGH (ref 70–99)
Potassium: 3.5 mmol/L (ref 3.5–5.1)
Sodium: 155 mmol/L — ABNORMAL HIGH (ref 135–145)

## 2019-07-17 LAB — CBC
HCT: 42.4 % (ref 39.0–52.0)
Hemoglobin: 13.7 g/dL (ref 13.0–17.0)
MCH: 20.5 pg — ABNORMAL LOW (ref 26.0–34.0)
MCHC: 32.3 g/dL (ref 30.0–36.0)
MCV: 63.4 fL — ABNORMAL LOW (ref 80.0–100.0)
Platelets: 167 10*3/uL (ref 150–400)
RBC: 6.69 MIL/uL — ABNORMAL HIGH (ref 4.22–5.81)
RDW: 18.5 % — ABNORMAL HIGH (ref 11.5–15.5)
WBC: 5.8 10*3/uL (ref 4.0–10.5)
nRBC: 0 % (ref 0.0–0.2)

## 2019-07-17 LAB — URINE CULTURE: Culture: 80000 — AB

## 2019-07-17 LAB — CULTURE, BLOOD (ROUTINE X 2): Special Requests: ADEQUATE

## 2019-07-17 LAB — GLUCOSE, CAPILLARY
Glucose-Capillary: 132 mg/dL — ABNORMAL HIGH (ref 70–99)
Glucose-Capillary: 155 mg/dL — ABNORMAL HIGH (ref 70–99)
Glucose-Capillary: 112 mg/dL — ABNORMAL HIGH (ref 70–99)
Glucose-Capillary: 65 mg/dL — ABNORMAL LOW (ref 70–99)

## 2019-07-17 LAB — CULTURE, RESPIRATORY W GRAM STAIN

## 2019-07-17 MED ORDER — CEFAZOLIN SODIUM-DEXTROSE 2-4 GM/100ML-% IV SOLN
2.0000 g | Freq: Three times a day (TID) | INTRAVENOUS | Status: AC
  Administered 2019-07-17 – 2019-07-19 (×8): 2 g via INTRAVENOUS
  Filled 2019-07-17 (×8): qty 100

## 2019-07-17 MED ORDER — SODIUM CHLORIDE 0.9 % IV SOLN: INTRAVENOUS | Status: DC

## 2019-07-17 MED ORDER — SODIUM CHLORIDE 0.45 % IV SOLN: INTRAVENOUS | Status: DC

## 2019-07-17 MED ORDER — INSULIN ASPART 100 UNIT/ML ~~LOC~~ SOLN
0.0000 [IU] | Freq: Three times a day (TID) | SUBCUTANEOUS | Status: DC
  Administered 2019-07-17: 1 [IU] via SUBCUTANEOUS
  Administered 2019-07-20: 2 [IU] via SUBCUTANEOUS

## 2019-07-17 NOTE — Plan of Care (Signed)
Discussed with patient plan of care for the evening, pain management and encouraging eating with some teach back displayed

## 2019-07-17 NOTE — Progress Notes (Signed)
STROKE TEAM PROGRESS NOTE   INTERVAL HISTORY   Wife, brother and son are at the bedside. Pt lying in bed, lethargic but able to open eyes and answer orientation questions. Still flaccid on the left with left facial droop. Na 155, put on NS. Yesterday fever 100.8, today sputum culture and urine culture back, switch Abx to cefazolin. CT showed still has MLS at 64mm.    Vitals:   07/17/19 0602 07/17/19 0722 07/17/19 1133 07/17/19 1622  BP: (!) 141/95 (!) 154/92 128/86 (!) 144/90  Pulse: 87 94 85 84  Resp:  20 (!) 22 18  Temp:  99.4 F (37.4 C) 98.4 F (36.9 C) 98.7 F (37.1 C)  TempSrc:  Oral Oral Oral  SpO2: 96% 94% 98% 97%  Weight:      Height:        CBC:  Recent Labs  Lab 07/16/19 0357 07/17/19 0244  WBC 15.3* 5.8  HGB 13.4 13.7  HCT 41.4 42.4  MCV 63.8* 63.4*  PLT 164 167    Basic Metabolic Panel:  Recent Labs  Lab 07/16/19 0357 07/16/19 0357 07/16/19 1524 07/17/19 0244  NA 160*   < > 157* 155*  K 3.7  --   --  3.5  CL 122*  --   --  119*  CO2 23  --   --  24  GLUCOSE 124*  --   --  133*  BUN 53*  --   --  40*  CREATININE 2.55*  --   --  2.30*  CALCIUM 8.8*  --   --  8.7*   < > = values in this interval not displayed.   Lipid Panel:     Component Value Date/Time   CHOL 198 07/10/2019 1256   TRIG 124 07/10/2019 1256   HDL 56 07/10/2019 1256   CHOLHDL 3.5 07/10/2019 1256   VLDL 25 07/10/2019 1256   LDLCALC 117 (H) 07/10/2019 1256   HgbA1c:  Lab Results  Component Value Date   HGBA1C 5.1 07/10/2019   Urine Drug Screen:     Component Value Date/Time   LABOPIA NONE DETECTED 07/10/2019 1800   COCAINSCRNUR NONE DETECTED 07/10/2019 1800   LABBENZ NONE DETECTED 07/10/2019 1800   AMPHETMU NONE DETECTED 07/10/2019 1800   THCU NONE DETECTED 07/10/2019 1800   LABBARB NONE DETECTED 07/10/2019 1800    Alcohol Level     Component Value Date/Time   ETH <10 07/10/2019 1256    IMAGING past 24 hours CT HEAD WO CONTRAST  Result Date: 07/17/2019 CLINICAL  DATA:  Follow-up cerebral hemorrhage EXAM: CT HEAD WITHOUT CONTRAST TECHNIQUE: Contiguous axial images were obtained from the base of the skull through the vertex without intravenous contrast. COMPARISON:  Five days ago FINDINGS: Brain: Subacute hemorrhage with unchanged size and shape, centered at the right basal ganglia and deep white matter tracks, nearly 6 cm anterior to posterior and 3 cm in transverse span. Adjacent vasogenic edema is stable. There is right-sided sulcal effacement and 7 mm of midline shift. No entrapment or complicating infarct. Chronic white matter disease Vascular: Atherosclerotic calcification Skull: Normal. Negative for fracture or focal lesion. Sinuses/Orbits: No acute finding. IMPRESSION: Size stable hematoma centered at the right putamen. There is moderate vasogenic edema and 7 mm of midline shift. Electronically Signed   By: Marnee Spring M.D.   On: 07/17/2019 11:33    EEG 07/15/19 ABNORMALITY  -Continuous slow, generalized and lateralized right hemisphere  IMPRESSION:  This study is suggestive of cortical dysfunction in  right  hemisphere likely secondary to underlying structural abnormality.  Additionally, there is evidence of moderate to severe diffuse  encephalopathy, nonspecific to etiology. No seizures or  epileptiform discharges were seen throughout the recording.   PHYSICAL EXAM    Temp:  [97.6 F (36.4 C)-100.8 F (38.2 C)] 98.7 F (37.1 C) (06/13 1622) Pulse Rate:  [76-103] 84 (06/13 1622) Resp:  [13-27] 18 (06/13 1622) BP: (121-154)/(82-95) 144/90 (06/13 1622) SpO2:  [91 %-98 %] 97 % (06/13 1622)  General - Well nourished, well developed, in no apparent distress, but lethargic.  Ophthalmologic - fundi not visualized due to noncooperation.  Cardiovascular - Regular rhythm and rate.  Neuro - lethargic, sleepy but easily arousable. Orientated to place, age, time and people. Following all simple commands, able to repeat 3 word sentences, paucity  of speech, able to name 2/3. Left hemianopia, not blinking to visual threat on the left. Eyes right gaze preference but able to have complete left gaze. PERRL. RUE and RLE 5/5 and LUE 0/5 and LLE 1/5 with pain stimulation. Sensation subjectively symmetrical, coordination not cooperative and gait not tested.    ASSESSMENT/PLAN [name] is a 59 y.o. male with history of HTN presenting with R gaze deviation and L sided flacidity. In ED developed less responsive state, HTN emergency and urine incontinence.   ICH:  R BG ICH with cerebral edema  Stroke: L MCA periventricular infarct, likely secondary to small vessel disease source  CT head x 2 R basal ganglia ICH w/ mass effect and 60mm R midline shift.   CTA head no LVO. Distal L A2 moderate/severe stenosis. Distal R A2 moderate stenosis.   MRI 6/7 Stable R basal ganglia ICH. L periatrial white matter infarct. Multiple scattered microhemorrhages w/ old L temporal lobe ICH. Small vessel disease. Atrophy.   CT head 6/10 unchanged R basal ganglia hemorrhage, increased edema. Mass effect and 35mm midline shift same. Small L parietal white matter infarct.    CT head 6/13 - stable hematoma and MLS at 7mm  2D Echo EF 55-60%. No source of embolus    EEG - 07/15/19 - moderate to severe diffuse encephalopathy, no seizures   LDL 117  HgbA1c 5.1  SCDs for VTE prophylaxis  No antithrombotic prior to admission, now on No antithrombotic given hemorrhage   Therapy recommendations:  CIR   Disposition:  pending    Cerebral Edema  CT/MRI w/ 76mm midline shift, unchanged  CT 6/13 MLS at 57mm  Neursurgery consult  (Ostergard) - no need for evacuation  off hypertonic saline   Na 141-143-146-141-149-152....159->160->157->155   Now on NS @ 75  Goal Na 150-155   Hypertensive Emergency  BP as high as 206/109  Home meds:  none  off cleviprex   prn labetalol and hydralazine . Increase SBP goal < 160 . On norvasc 10mg  and Losartan 25   . HCTZ DC'd due to increased Cre . Long-term BP goal normotensive  Fever  Leukocytosis   WBC 11.0->10.3 ......17.3->15.3->5.8  T-max 100.8  Sputum culture showed Klebsiella pneumonie   Urine culture showed Citrobacter Koseri  Switch Zosyn to cefazolin  Hyperlipidemia  Home meds:  No statin  LDL 117, goal < 70  Hold statin in setting of ICH  Consider statin on discharge  AKI   Cre 1.34->1.20->1.15......2.32->2.55->2.30  D/C HCTZ  Continue IV fluid  Dysphagia . Secondary to stroke . On dysphagia 1 and honey thick . Continue IV fluid . Speech on board   Other Stroke Risk  Factors    Other Active Problems  Lethargy -> amantadine trial started 6/11   Hospital day # 7   Patient condition worsened within the last 24 hours, has developed fever and worsening renal function, continues to have cerebral edema on repeat CT, and I changed half-normal saline to normal saline, changed antibiotics from Zosyn to cefazolin, continue IV fluid. I discussed with patient daughter and wife at bedside. I spent  35 minutes in total face-to-face time with the patient, more than 50% of which was spent in counseling and coordination of care, reviewing test results, images and medication, and discussing the diagnosis, treatment plan and potential prognosis. This patient's care requiresreview of multiple databases, neurological assessment, discussion with family, other specialists and medical decision making of high complexity.  Marvel Plan, MD PhD Stroke Neurology 07/17/2019 5:57 PM  To contact Stroke Continuity provider, please refer to [email or web address]. After hours, contact General Neurology

## 2019-07-17 NOTE — Plan of Care (Signed)
Discussed with patient plan of care for the evening, pain management and keeping temperature down with some teach back displayed

## 2019-07-18 LAB — BASIC METABOLIC PANEL WITH GFR
Anion gap: 9 (ref 5–15)
BUN: 25 mg/dL — ABNORMAL HIGH (ref 6–20)
CO2: 23 mmol/L (ref 22–32)
Calcium: 8.7 mg/dL — ABNORMAL LOW (ref 8.9–10.3)
Chloride: 118 mmol/L — ABNORMAL HIGH (ref 98–111)
Creatinine, Ser: 1.54 mg/dL — ABNORMAL HIGH (ref 0.61–1.24)
GFR calc Af Amer: 58 mL/min — ABNORMAL LOW (ref >60)
GFR calc non Af Amer: 50 mL/min — ABNORMAL LOW (ref >60)
Glucose, Bld: 124 mg/dL — ABNORMAL HIGH (ref 70–99)
Potassium: 3.5 mmol/L (ref 3.5–5.1)
Sodium: 150 mmol/L — ABNORMAL HIGH (ref 135–145)

## 2019-07-18 LAB — CBC
HCT: 42.7 % (ref 39.0–52.0)
Hemoglobin: 14 g/dL (ref 13.0–17.0)
MCH: 20.6 pg — ABNORMAL LOW (ref 26.0–34.0)
MCHC: 32.8 g/dL (ref 30.0–36.0)
MCV: 62.9 fL — ABNORMAL LOW (ref 80.0–100.0)
Platelets: 165 K/uL (ref 150–400)
RBC: 6.79 MIL/uL — ABNORMAL HIGH (ref 4.22–5.81)
RDW: 18.6 % — ABNORMAL HIGH (ref 11.5–15.5)
WBC: 3.6 K/uL — ABNORMAL LOW (ref 4.0–10.5)
nRBC: 0 % (ref 0.0–0.2)

## 2019-07-18 LAB — GLUCOSE, CAPILLARY
Glucose-Capillary: 102 mg/dL — ABNORMAL HIGH (ref 70–99)
Glucose-Capillary: 137 mg/dL — ABNORMAL HIGH (ref 70–99)
Glucose-Capillary: 107 mg/dL — ABNORMAL HIGH (ref 70–99)
Glucose-Capillary: 124 mg/dL — ABNORMAL HIGH (ref 70–99)
Glucose-Capillary: 151 mg/dL — ABNORMAL HIGH (ref 70–99)

## 2019-07-18 MED ORDER — LIDOCAINE 5 % EX PTCH
1.0000 | MEDICATED_PATCH | CUTANEOUS | Status: DC
  Administered 2019-07-18 – 2019-07-20 (×3): 1 via TRANSDERMAL
  Filled 2019-07-18 (×3): qty 1

## 2019-07-18 NOTE — Plan of Care (Signed)
Dicsussed with patient and wife plan of care for the evening, pain management and wanting to be closer to home to do CIR with some teach back displayed.  Family has to drive over and 1 hour one way to get to him.
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
## 2019-07-18 NOTE — Progress Notes (Signed)
STROKE TEAM PROGRESS NOTE   INTERVAL HISTORY   No family at bedside.  Patient sleepy drowsy, however still arousable, following simple commands on the right side.  Left hemiplegic.  Sodium 150.  Continue normal saline.  Creatinine trending down.   Vitals:   07/18/19 0850 07/18/19 0900 07/18/19 1000 07/18/19 1100  BP: 136/90   (!) 155/96  Pulse:  93 90 85  Resp:  (!) 23 (!) 24 (!) 23  Temp:    98.2 F (36.8 C)  TempSrc:    Oral  SpO2:  93% 96% 93%  Weight:      Height:       CBC:  Recent Labs  Lab 07/17/19 0244 07/18/19 0953  WBC 5.8 3.6*  HGB 13.7 14.0  HCT 42.4 42.7  MCV 63.4* 62.9*  PLT 167 160   Basic Metabolic Panel:  Recent Labs  Lab 07/17/19 0244 07/18/19 0953  NA 155* 150*  K 3.5 3.5  CL 119* 118*  CO2 24 23  GLUCOSE 133* 124*  BUN 40* 25*  CREATININE 2.30* 1.54*  CALCIUM 8.7* 8.7*   Lipid Panel:     Component Value Date/Time   CHOL 198 07/10/2019 1256   TRIG 124 07/10/2019 1256   HDL 56 07/10/2019 1256   CHOLHDL 3.5 07/10/2019 1256   VLDL 25 07/10/2019 1256   LDLCALC 117 (H) 07/10/2019 1256   HgbA1c:  Lab Results  Component Value Date   HGBA1C 5.1 07/10/2019   Urine Drug Screen:     Component Value Date/Time   LABOPIA NONE DETECTED 07/10/2019 1800   COCAINSCRNUR NONE DETECTED 07/10/2019 1800   LABBENZ NONE DETECTED 07/10/2019 1800   AMPHETMU NONE DETECTED 07/10/2019 1800   THCU NONE DETECTED 07/10/2019 1800   LABBARB NONE DETECTED 07/10/2019 1800    Alcohol Level     Component Value Date/Time   ETH <10 07/10/2019 1256    IMAGING past 24 hours No results found.   PHYSICAL EXAM  Temp:  [98.2 F (36.8 C)-99.8 F (37.7 C)] 98.2 F (36.8 C) (06/14 1100) Pulse Rate:  [77-107] 85 (06/14 1100) Resp:  [14-30] 23 (06/14 1100) BP: (122-174)/(80-101) 155/96 (06/14 1100) SpO2:  [90 %-97 %] 93 % (06/14 1100)  General - Well nourished, well developed, in no apparent distress, but drowsy and sleepy.  Ophthalmologic - fundi not  visualized due to noncooperation.  Cardiovascular - Regular rhythm and rate.  Neuro - lethargic, sleepy but arousable. Orientated to place, age, time and people. Following all simple commands, able to repeat 3 word sentences, paucity of speech, able to name 2/3. Left hemianopia, not blinking to visual threat on the left. Eyes right gaze preference but able to have complete left gaze. PERRL. RUE and RLE 5/5 and LUE 0/5 and LLE 1/5 with pain stimulation. Sensation subjectively symmetrical, coordination not cooperative and gait not tested.    ASSESSMENT/PLAN Jonathan Flowers is a 59 y.o. male with history of HTN presenting with R gaze deviation and L sided flacidity. In ED developed less responsive state, HTN emergency and urine incontinence.   ICH:  R BG ICH with cerebral edema  Stroke: L MCA periventricular infarct, likely secondary to small vessel disease source  CT head x 2 R basal ganglia ICH w/ mass effect and 55mm R midline shift.   CTA head no LVO. Distal L A2 moderate/severe stenosis. Distal R A2 moderate stenosis.   MRI 6/7 Stable R basal ganglia ICH. L periatrial white matter infarct. Multiple scattered microhemorrhages w/ old  L temporal lobe ICH. Small vessel disease. Atrophy.   CT head 6/10 unchanged R basal ganglia hemorrhage, increased edema. Mass effect and 9mm midline shift same. Small L parietal white matter infarct.    CT head 6/13 - stable hematoma and MLS at 85mm  2D Echo EF 55-60%. No source of embolus    EEG - 07/15/19 - moderate to severe diffuse encephalopathy, no seizures   LDL 117  HgbA1c 5.1  SCDs for VTE prophylaxis  No antithrombotic prior to admission, now on No antithrombotic given hemorrhage   Therapy recommendations:  CIR (not at CIR level) ->SNF   Disposition:  pending   Cerebral Edema  CT/MRI w/ 22mm midline shift, unchanged  CT 6/13 MLS at 53mm  Neursurgery consult  ([place]st[place]) - no need for evacuation  off hypertonic saline   Na  141-143-146-141-149-152....159->160->157->155->150   Now on NS @ 75  Hypertensive Emergency  BP as high as 206/109  Home meds:  none  off cleviprex   prn labetalol and hydralazine . Increase SBP goal < 160 . On norvasc 10mg  and Losartan 25  . HCTZ DC'd due to increased Cre . Long-term BP goal normotensive  UTI  WBC 11.0->10.3 ......17.3->15.3->5.8->3.6  afebrile  Sputum culture showed Klebsiella pneumonie   Urine culture showed Citrobacter Koseri  Switch Zosyn to cefazolin 6/13>>  Hyperlipidemia  Home meds:  No statin  LDL 117, goal < 70  Hold statin in setting of ICH  Consider statin on discharge  AKI   Cre 1.34->1.20->1.15......2.32->2.55->2.30->1.54  D/C HCTZ  Continue IV fluid  Encourage p.o. intake  Dysphagia . Secondary to stroke . On dysphagia 1 and honey thick . Continue IV fluid . Speech on board   Other Stroke Risk Factors    Other Active Problems  Lethargy -> amantadine trial started 6/11   Hospital day # 8    8/11, MD PhD Stroke Neurology 07/18/2019 2:45 PM  To contact Stroke Continuity provider, please refer to 07/20/2019. After hours, contact General Neurology

## 2019-07-18 NOTE — TOC Initial Note (Signed)
Transition of Care [facility]) - Initial/Assessment Note    Patient Details  Name: Jonathan Flowers MRN: [id number] Date of Birth: July 09, 1960  Transition of Care [facility]) CM/SW Contact:    Pollie Friar, RN Phone Number: 07/18/2019, 2:27 PM  Clinical Narrative:                 CIR is recommending SNF for rehab. CM met with the patient to go over d/c plans and he is in agreement with his brother being involved in decision making. CM reached out to his brother and he asked that pt be in a rehab closer to home. CM inquired about a facility and he asked for [facility] in [place], [place]. [facility] called [facility] and faxed them the needed information. TOC following.  Expected Discharge Plan: Skilled Nursing Facility Barriers to Discharge: Continued Medical Work up   Patient Goals and CMS Choice   CMS Medicare.gov Compare Post Acute Care list provided to:: Patient Represenative (must comment) Choice offered to / list presented to : Sibling  Expected Discharge Plan and Services Expected Discharge Plan: [place] In-house Referral: Clinical Social Work Discharge Planning Services: CM Consult Post Acute Care Choice: [place] Living arrangements for the past 2 months: [place]                                      Prior Living Arrangements/Services Living arrangements for the past 2 months: Single Family Home Lives with:: Spouse Patient language and need for interpreter reviewed:: Yes        Need for Family Participation in Patient Care: Yes (Comment) Care giver support system in place?: No (comment)   Criminal Activity/Legal Involvement Pertinent to Current Situation/Hospitalization: No - Comment as needed  Activities of Daily Living Home Assistive Devices/Equipment: None ADL Screening (condition at time of admission) Patient's cognitive ability adequate to safely complete daily activities?: No Is the patient deaf or have difficulty hearing?:  No Does the patient have difficulty seeing, even when wearing glasses/contacts?: Yes Does the patient have difficulty concentrating, remembering, or making decisions?: No Patient able to express need for assistance with ADLs?: Yes Does the patient have difficulty dressing or bathing?: No Independently performs ADLs?: Yes (appropriate for developmental age) Does the patient have difficulty walking or climbing stairs?: Yes Weakness of Legs: Left Weakness of Arms/Hands: Left  Permission Sought/Granted                  Emotional Assessment Appearance:: Appears stated age Attitude/Demeanor/Rapport: Engaged Affect (typically observed): Accepting Orientation: : Oriented to Self, Oriented to Place   Psych Involvement: No (comment)  Admission diagnosis:  ICH (intracerebral hemorrhage) ([place]) [I61.9] Patient Active Problem List   Diagnosis Date Noted  . AKI (acute kidney injury) ([place])   . Hyperglycemia   . Hypernatremia   . Hypokalemia   . Essential hypertension   . Sinus tachycardia   . Tachypnea   . Supplemental oxygen dependent   . ICH (intracerebral hemorrhage) ([place]) 07/10/2019   PCP:  Patient, No Pcp Per Pharmacy:   [facility] [place], [place] [address] Phone: [phone] Fax: [phone]     Social Determinants of Health (SDOH) Interventions    Readmission Risk Interventions No flowsheet data found.

## 2019-07-18 NOTE — Progress Notes (Signed)
Physical Therapy Treatment Patient Details Name: Jonathan Flowers MRN: [id number] DOB: July 29, 1960 Today's Date: 07/18/2019    History of Present Illness Jonathan Flowers is an 59 y.o. male with a PMHx of HTN who presented to the [facility] ED as a code stroke for acute onset of right gaze deviation and left sided flaccid weakness. 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 Surrounding edema. Mass effect with right-to-left shift of 6 mm. Pt becomes more lethargic on 6/10, found to have UTI    PT Comments    Pt was seen for bed mob, attempt to stand and control of sitting balance.  He is having symptoms of pusher syndrome, and used pillows to support sitting posture under LUE.  He is alternately unsafe pulling forward or to the side, versus controlling with min assist sitting closer to midline.  Worked on standing with pt demonstrating no LE effort to help stand.  Follow acutely to work on pieces of skills to get more safety and mobility accomplished toward dc to CIR.   Follow Up Recommendations  CIR     Equipment Recommendations  Wheelchair (measurements PT);Wheelchair cushion (measurements PT);Hospital bed    Recommendations for Other Services Rehab consult     Precautions / Restrictions Precautions Precautions: Fall Precaution Comments: monitor vitals on telemetry Restrictions Weight Bearing Restrictions: No Other Position/Activity Restrictions: pusher syndrome    Mobility  Bed Mobility Overal bed mobility: Needs Assistance Bed Mobility: Supine to Sit;Sit to Supine     Supine to sit: Max assist;HOB elevated Sit to supine: Max assist   General bed mobility comments: total assist to scoot up in bed  Transfers Overall transfer level: Needs assistance Equipment used: 1 person hand held assist Transfers: Sit to/from Stand Sit to Stand: Total assist         General transfer comment: even with knee block pt made no effort to assist  standing  Ambulation/Gait             General Gait Details: unable   Stairs             Wheelchair Mobility    Modified Rankin (Stroke Patients Only)       Balance Overall balance assessment: Needs assistance Sitting-balance support: Bilateral upper extremity supported;Feet supported Sitting balance-Leahy Scale: Poor Sitting balance - Comments: fluctuating shift of posture, total assist at times to max assist, to min assist Postural control: Left lateral lean   Standing balance-Leahy Scale: Zero                              Cognition Arousal/Alertness: Lethargic Behavior During Therapy: Flat affect Overall Cognitive Status: Impaired/Different from baseline Area of Impairment: Problem solving;Awareness;Safety/judgement;Following commands;Memory;Attention;Orientation                 Orientation Level: Time Current Attention Level: Selective Memory: Decreased recall of precautions;Decreased short-term memory Following Commands: Follows one step commands inconsistently;Follows one step commands with increased time Safety/Judgement: Decreased awareness of deficits;Decreased awareness of safety Awareness: Intellectual Problem Solving: Slow processing;Requires verbal cues;Requires tactile cues        Exercises      General Comments General comments (skin integrity, edema, etc.): pt is able to give PT his name, but is not verbal during the session.  Following instructions for postural control with variable effort, although he immediately used legs to help scoot up in bed.  Fell asleep immediately after PT  Pertinent Vitals/Pain Pain Assessment: No/denies pain    Home Living                      Prior Function            PT Goals (current goals can now be found in the care plan section) Acute Rehab PT Goals Patient Stated Goal: none stated Progress towards PT goals: Progressing toward goals    Frequency    Min  4X/week      PT Plan Current plan remains appropriate    Co-evaluation              AM-PAC PT "6 Clicks" Mobility   Outcome Measure  Help needed turning from your back to your side while in a flat bed without using bedrails?: A Lot Help needed moving from lying on your back to sitting on the side of a flat bed without using bedrails?: A Lot Help needed moving to and from a bed to a chair (including a wheelchair)?: Total Help needed standing up from a chair using your arms (e.g., wheelchair or bedside chair)?: Total Help needed to walk in hospital room?: Total Help needed climbing 3-5 steps with a railing? : Total 6 Click Score: 8    End of Session Equipment Utilized During Treatment: Other (comment) (pillows for support of posture) Activity Tolerance: Patient limited by lethargy;Treatment limited secondary to medical complications (Comment) Patient left: in bed;with call bell/phone within reach;with bed alarm set Nurse Communication: Mobility status PT Visit Diagnosis: Other abnormalities of gait and mobility (R26.89);Muscle weakness (generalized) (M62.81);Other symptoms and signs involving the nervous system (R29.898)     Time: 1041-1105 PT Time Calculation (min) (ACUTE ONLY): 24 min  Charges:  $Therapeutic Activity: 8-22 mins $Neuromuscular Re-education: 8-22 mins            Ramond Dial 07/18/2019, 12:47 PM  Mee Hives, PT MS Acute Rehab Dept. Number: [place] and [place]

## 2019-07-18 NOTE — NC FL2 (Signed)
[place] MEDICAID FL2 LEVEL OF CARE SCREENING TOOL     IDENTIFICATION  Patient Name: Jonathan Flowers Birthdate: [birth year] Sex: male Admission Date (Current Location): 07/10/2019  [place] and [place] Number:   [facility])   Facility and Address:  [facility], [address] [place], [place] [address]      Provider Number: [id number]  Attending Physician Name and Address:  Dorcas Carrow, MD  Relative Name and Phone Number:       Current Level of Care: Hospital Recommended Level of Care: Skilled Nursing Facility Prior Approval Number:    Date Approved/Denied:   PASRR Number: [id number]  Discharge Plan: SNF    Current Diagnoses: Patient Active Problem List   Diagnosis Date Noted  . AKI (acute kidney injury) ([facility])   . Hyperglycemia   . Hypernatremia   . Hypokalemia   . Essential hypertension   . Sinus tachycardia   . Tachypnea   . Supplemental oxygen dependent   . ICH (intracerebral hemorrhage) ([facility]) 07/10/2019    Orientation RESPIRATION BLADDER Height & Weight     Self, Place  Normal, O2 (see d/c summary for any oxygen requirements) Incontinent Weight: 71.4 kg Height:  5\' 4"  (162.6 cm)  BEHAVIORAL SYMPTOMS/MOOD NEUROLOGICAL BOWEL NUTRITION STATUS      Incontinent Diet (dysphagia 1 with honey thick liquids)  AMBULATORY STATUS COMMUNICATION OF NEEDS Skin   Extensive Assist Verbally Bruising (to nose)                       Personal Care Assistance Level of Assistance  Bathing, Feeding, Dressing Bathing Assistance: Maximum assistance Feeding assistance: Maximum assistance Dressing Assistance: Maximum assistance     Functional Limitations Info  Sight, Hearing, Speech Sight Info: Impaired (Rt gaze deviation) Hearing Info: Adequate Speech Info: Impaired (slur/dysarthria)    SPECIAL CARE FACTORS FREQUENCY  PT (By licensed PT), OT (By licensed OT), Speech therapy     PT Frequency: 5x/wk OT Frequency: 5x/wk     Speech Therapy  Frequency: 5x/wk      Contractures Contractures Info: Not present    Additional Factors Info  Code Status, Allergies, Insulin Sliding Scale, Suctioning Needs Code Status Info: Full Allergies Info: NKA   Insulin Sliding Scale Info: Novolg 0-6 units SQ three times a day   Suctioning Needs: oral suctioning as needed   Current Medications (07/18/2019):  This is the current hospital active medication list Current Facility-Administered Medications  Medication Dose Route Frequency Provider Last Rate Last Admin  . 0.9 %  sodium chloride infusion   Intravenous Continuous 07/20/2019, MD 75 mL/hr at 07/18/19 1256 Restarted at 07/18/19 1256  . acetaminophen (TYLENOL) tablet 650 mg  650 mg Oral Q4H PRN 07/20/19, NP   650 mg at 07/17/19 1844   Or  . acetaminophen (TYLENOL) 160 MG/5ML solution 650 mg  650 mg Per Tube Q4H PRN 07/19/19, NP       Or  . acetaminophen (TYLENOL) suppository 650 mg  650 mg Rectal Q4H PRN Layne Benton, NP   650 mg at 07/14/19 2058  . amantadine (SYMMETREL) capsule 100 mg  100 mg Oral BID 2059 L, NP   100 mg at 07/18/19 1255  . amLODipine (NORVASC) tablet 10 mg  10 mg Oral Daily 07/20/19 L, NP   10 mg at 07/18/19 0850  . ceFAZolin (ANCEF) IVPB 2g/100 mL premix  2 g Intravenous Q8H 07/20/19, MD 200 mL/hr at  07/18/19 1257 2 g at 07/18/19 1257  . chlorhexidine (PERIDEX) 0.12 % solution 15 mL  15 mL Mouth Rinse BID Burnetta Sabin L, NP   15 mL at 07/18/19 0848  . Chlorhexidine Gluconate Cloth 2 % PADS 6 each  6 each Topical Daily Donzetta Starch, NP   6 each at 07/18/19 0851  . enoxaparin (LOVENOX) injection 40 mg  40 mg Subcutaneous Q24H Burnetta Sabin L, NP   40 mg at 07/18/19 1255  . hydrALAZINE (APRESOLINE) injection 20 mg  20 mg Intravenous Q6H PRN Donzetta Starch, NP   20 mg at 07/18/19 0312  . insulin aspart (novoLOG) injection 0-6 Units  0-6 Units Subcutaneous TID WC Rosalin Hawking, MD   1 Units at 07/17/19 1649  . labetalol (NORMODYNE) injection  20-40 mg  20-40 mg Intravenous Q2H PRN Donzetta Starch, NP   20 mg at 07/14/19 0509  . LORazepam (ATIVAN) injection 2 mg  2 mg Intravenous Once Donzetta Starch, NP      . losartan (COZAAR) tablet 25 mg  25 mg Oral Daily Burnetta Sabin L, NP   25 mg at 07/18/19 0850  . MEDLINE mouth rinse  15 mL Mouth Rinse q12n4p Biby, Sharon L, NP   15 mL at 07/18/19 1255  . pantoprazole (PROTONIX) EC tablet 40 mg  40 mg Oral Daily Rinehuls, David L, PA-C   40 mg at 07/18/19 0848  . polyethylene glycol (MIRALAX / GLYCOLAX) packet 17 g  17 g Oral Daily Burnetta Sabin L, NP   17 g at 07/18/19 0851  . Resource ThickenUp Clear   Oral PRN Donzetta Starch, NP      . senna-docusate (Senokot-S) tablet 1 tablet  1 tablet Oral BID Donzetta Starch, NP   1 tablet at 07/18/19 1443     Discharge Medications: Please see discharge summary for a list of discharge medications.  Relevant Imaging Results:  Relevant Lab Results:   Additional Information SS#: [id number]  Pollie Friar, RN

## 2019-07-18 NOTE — Progress Notes (Signed)
Inpatient Rehabilitation Admissions Coordinator  Patient not at a level for CIR at this time. Would recommend SNF at this time.  Ottie Glazier, RN, MSN Rehab Admissions Coordinator [phone] 07/18/2019 1:07 PM

## 2019-07-18 NOTE — Progress Notes (Signed)
Hypoglycemic Event  CBG: 65  Treatment: Feed Pudding and Drank Juice  Symptoms: Asymptomatic  Follow-up CBG: Time:2111 CBG Result:112  Possible Reasons for Event: Didn't eat much dinner  Comments/MD notified: Will feed more with bedtime medications and recheck at 0000    Jonathan Flowers

## 2019-07-18 NOTE — Progress Notes (Signed)
  Speech Language Pathology Treatment: Dysphagia  Patient Details Name: Jonathan Flowers MRN: [id number] DOB: Oct 29, 1960 Today's Date: 07/18/2019 Time: 7124-5809 SLP Time Calculation (min) (ACUTE ONLY): 15 min  Assessment / Plan / Recommendation Clinical Impression  SLP followed up for dysphagia intervention. Pt continues to exhibit oral deficits (left greater than right) including reduced labial seat with intermittent left sided anterior spillage, prolonged mastication/reduced bolus cohesion; with small solid trial), oral pocketing, and reduced timely AP transit. Per palpation swallow initiation suspected to be delayed. Trialed nectar thick liquids per cup, no overt s/sx of aspiration exhibited however risk for silent aspiration remains significantly increased s/p large right ICH. Recommend objective swallow study prior to diet upgrade as pt mentation allows. Waxing and waning noted in pt alertness with extensive cueing required.     HPI HPI:  59 y.o. man w/ acute onset headache with left sided weakness. CTH  shows right basal ganglia ICH measuring 5.4x2.9x4.0cm with 5.2mm of midline shift, volume estimated at 32-38cc.      SLP Plan  Continue with current plan of care       Recommendations  Diet recommendations: Honey-thick liquid;Dysphagia 1 (puree) Liquids provided via: Cup;Teaspoon Medication Administration: Whole meds with puree Supervision: Staff to assist with self feeding;Full supervision/cueing for compensatory strategies Compensations: Slow rate;Small sips/bites Postural Changes and/or Swallow Maneuvers: Seated upright 90 degrees                Oral Care Recommendations: Oral care BID Follow up Recommendations: Inpatient Rehab SLP Visit Diagnosis: Dysphagia, unspecified (R13.10) Plan: Continue with current plan of care       GO                Jonathan Flowers E Jonathan Lafrance  MA, CCC-SLP  Acute Rehabilitation Services 07/18/2019, 12:02 PM

## 2019-07-19 ENCOUNTER — Inpatient Hospital Stay (HOSPITAL_COMMUNITY): Payer: 59

## 2019-07-19 LAB — BASIC METABOLIC PANEL
Anion gap: 9 (ref 5–15)
BUN: 24 mg/dL — ABNORMAL HIGH (ref 6–20)
CO2: 23 mmol/L (ref 22–32)
Calcium: 8.6 mg/dL — ABNORMAL LOW (ref 8.9–10.3)
Chloride: 119 mmol/L — ABNORMAL HIGH (ref 98–111)
Creatinine, Ser: 1.33 mg/dL — ABNORMAL HIGH (ref 0.61–1.24)
GFR calc Af Amer: >60 mL/min (ref >60)
GFR calc non Af Amer: 59 mL/min — ABNORMAL LOW (ref >60)
Glucose, Bld: 121 mg/dL — ABNORMAL HIGH (ref 70–99)
Potassium: 3.8 mmol/L (ref 3.5–5.1)
Sodium: 151 mmol/L — ABNORMAL HIGH (ref 135–145)

## 2019-07-19 LAB — CBC
HCT: 40.8 % (ref 39.0–52.0)
Hemoglobin: 13.3 g/dL (ref 13.0–17.0)
MCH: 20.5 pg — ABNORMAL LOW (ref 26.0–34.0)
MCHC: 32.6 g/dL (ref 30.0–36.0)
MCV: 62.9 fL — ABNORMAL LOW (ref 80.0–100.0)
Platelets: 162 10*3/uL (ref 150–400)
RBC: 6.49 MIL/uL — ABNORMAL HIGH (ref 4.22–5.81)
RDW: 18.1 % — ABNORMAL HIGH (ref 11.5–15.5)
WBC: 3.7 10*3/uL — ABNORMAL LOW (ref 4.0–10.5)
nRBC: 0 % (ref 0.0–0.2)

## 2019-07-19 LAB — CULTURE, BLOOD (ROUTINE X 2)
Culture: NO GROWTH
Special Requests: ADEQUATE

## 2019-07-19 LAB — GLUCOSE, CAPILLARY
Glucose-Capillary: 114 mg/dL — ABNORMAL HIGH (ref 70–99)
Glucose-Capillary: 113 mg/dL — ABNORMAL HIGH (ref 70–99)
Glucose-Capillary: 142 mg/dL — ABNORMAL HIGH (ref 70–99)
Glucose-Capillary: 156 mg/dL — ABNORMAL HIGH (ref 70–99)

## 2019-07-19 MED ORDER — LOSARTAN POTASSIUM 50 MG PO TABS
50.0000 mg | ORAL_TABLET | Freq: Every day | ORAL | Status: DC
  Administered 2019-07-20: 50 mg via ORAL
  Filled 2019-07-19: qty 1

## 2019-07-19 NOTE — Progress Notes (Signed)
Occupational Therapy Treatment Patient Details Name: Jonathan Flowers MRN: [id number] DOB: [birth year] Today's Date: 07/19/2019    History of present illness Jonathan Flowers is an 59 y.o. male with a PMHx of HTN who presented to the [facility] ED as a code stroke for acute onset of right gaze deviation and left sided flaccid weakness. 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 Surrounding edema. Mass effect with right-to-left shift of 6 mm. Pt becomes more lethargic on 6/10, found to have UTI   OT comments  Pt seen in conjunction with PT to optimize pts activity tolerance and progress pt towards functional mobility goals. Pt making steady progress towards OT goals this session with pt needing MAX A +2 for bed mobility and MAX A for sitting balance EOB. Pt able to sit EOB ~ 5 mins before returning self to supine. Pt continues to present with L neglect, poor sitting balance, decreased activity tolerance, and generalized weakness impacting pts ability to engage in BADLs. Noted pt denied CIR therefore updated pt to SNF. Will let OTR know about change in POC. Will follow acutely for OT needs.    Follow Up Recommendations  SNF;Supervision/Assistance - 24 hour    Equipment Recommendations  3 in 1 bedside commode    Recommendations for Other Services      Precautions / Restrictions Precautions Precautions: Fall Restrictions Other Position/Activity Restrictions: pusher syndrome       Mobility Bed Mobility Overal bed mobility: Needs Assistance Bed Mobility: Supine to Sit;Sit to Supine     Supine to sit: Max assist;HOB elevated Sit to supine: Mod assist   General bed mobility comments: MAX A to advance to EOB to pts R side, pt reaching with RUE for rail, but required assist to scoot hips to EOB and elevate trunk. pt began to return self to supine from sitting EOB however required MOD A to return BLEs to supine  Transfers                  General transfer comment: defer d/t fatigue as pt began to return self to supine    Balance Overall balance assessment: Needs assistance Sitting-balance support: Feet supported;Single extremity supported Sitting balance-Leahy Scale: Poor Sitting balance - Comments: MAX A for sitting balance Postural control: Left lateral lean                                 ADL either performed or assessed with clinical judgement   ADL Overall ADL's : Needs assistance/impaired                                     Functional mobility during ADLs: Maximal assistance;+2 for physical assistance (bed mobility) General ADL Comments: pt continues to present with impaired sitting balance; L visual neglect, generalized weakness and impaired ability to care for self. Pt requried MAX  A for sitting balance EOB and mutlimodal cues to keep head elevated. pt wtih no awarenes to L side     Vision Baseline Vision/History:  (L field cut) Patient Visual Report: Other (comment) Vision Assessment?:  (unable to track past midline to L; does not respond to threat on L side)   Perception     Praxis      Cognition Arousal/Alertness: Awake/alert Behavior During Therapy: WFL for tasks assessed/performed Overall Cognitive Status: Impaired/Different  from baseline Area of Impairment: Problem solving;Awareness;Safety/judgement;Following commands;Attention                   Current Attention Level: Selective   Following Commands: Follows one step commands with increased time Safety/Judgement: Decreased awareness of safety Awareness: Intellectual Problem Solving: Slow processing;Requires verbal cues;Requires tactile cues          Exercises Other Exercises Other Exercises: attempted to orient pt to L side of body with little success Other Exercises: Passive cervical rotation R/L seated EOB;    Shoulder Instructions       General Comments pts brother present during beginning  of session. brother wrote down nieces number for RN to communicate with    Pertinent Vitals/ Pain       Pain Assessment: Faces Faces Pain Scale: No hurt  Home Living                                          Prior Functioning/Environment              Frequency  Min 2X/week        Progress Toward Goals  OT Goals(current goals can now be found in the care plan section)  Progress towards OT goals: Progressing toward goals  Acute Rehab OT Goals Patient Stated Goal: none stated OT Goal Formulation: With patient Time For Goal Achievement: 07/27/19 Potential to Achieve Goals: Good  Plan Discharge plan needs to be updated;Frequency needs to be updated    Co-evaluation    PT/OT/SLP Co-Evaluation/Treatment: Yes Reason for Co-Treatment: Complexity of the patient's impairments (multi-system involvement);To address functional/ADL transfers;For patient/therapist safety   OT goals addressed during session: ADL's and self-care      AM-PAC OT "6 Clicks" Daily Activity     Outcome Measure   Help from another person eating meals?: Total Help from another person taking care of personal grooming?: A Lot Help from another person toileting, which includes using toliet, bedpan, or urinal?: Total Help from another person bathing (including washing, rinsing, drying)?: Total Help from another person to put on and taking off regular upper body clothing?: Total Help from another person to put on and taking off regular lower body clothing?: Total 6 Click Score: 7    End of Session    OT Visit Diagnosis: Unsteadiness on feet (R26.81);Other abnormalities of gait and mobility (R26.89);Other symptoms and signs involving the nervous system (R29.898);Other symptoms and signs involving cognitive function;Hemiplegia and hemiparesis Hemiplegia - Right/Left: Left Hemiplegia - dominant/non-dominant: Non-Dominant Hemiplegia - caused by: Nontraumatic intracerebral hemorrhage    Activity Tolerance Patient tolerated treatment well   Patient Left in bed;with call bell/phone within reach;with bed alarm set   Nurse Communication Mobility status;Other (comment) (requesting something to drink)        Time: 1210-1230 OT Time Calculation (min): 20 min  Charges: OT General Charges $OT Visit: 1 Visit OT Treatments $Therapeutic Activity: 8-22 mins  Audery Amel., COTA/L Acute Rehabilitation Services [phone]    Angelina Pih 07/19/2019, 2:53 PM

## 2019-07-19 NOTE — Progress Notes (Signed)
STROKE TEAM PROGRESS NOTE   INTERVAL HISTORY   No family at bedside. Pt lying in bed, awake alert today, still has right gaze with left neglect, left hemiplegia with left asomatognosia. Na 151, and Cre continue to improve. PT/OT recommend SNF per family request.   Vitals:   07/19/19 1200 07/19/19 1300 07/19/19 1400 07/19/19 1550  BP:    (!) 149/88  Pulse: 90 85 82 81  Resp: (!) 22 (!) 24 (!) 24 20  Temp:    98.3 F (36.8 C)  TempSrc:    Axillary  SpO2: 94% 95% 95% 96%  Weight:      Height:       CBC:  Recent Labs  Lab 07/18/19 0953 07/19/19 0521  WBC 3.6* 3.7*  HGB 14.0 13.3  HCT 42.7 40.8  MCV 62.9* 62.9*  PLT 165 162   Basic Metabolic Panel:  Recent Labs  Lab 07/18/19 0953 07/19/19 0521  NA 150* 151*  K 3.5 3.8  CL 118* 119*  CO2 23 23  GLUCOSE 124* 121*  BUN 25* 24*  CREATININE 1.54* 1.33*  CALCIUM 8.7* 8.6*   Lipid Panel:     Component Value Date/Time   CHOL 198 07/10/2019 1256   TRIG 124 07/10/2019 1256   HDL 56 07/10/2019 1256   CHOLHDL 3.5 07/10/2019 1256   VLDL 25 07/10/2019 1256   LDLCALC 117 (H) 07/10/2019 1256   HgbA1c:  Lab Results  Component Value Date   HGBA1C 5.1 07/10/2019   Urine Drug Screen:     Component Value Date/Time   LABOPIA NONE DETECTED 07/10/2019 1800   COCAINSCRNUR NONE DETECTED 07/10/2019 1800   LABBENZ NONE DETECTED 07/10/2019 1800   AMPHETMU NONE DETECTED 07/10/2019 1800   THCU NONE DETECTED 07/10/2019 1800   LABBARB NONE DETECTED 07/10/2019 1800    Alcohol Level     Component Value Date/Time   ETH <10 07/10/2019 1256    IMAGING past 24 hours DG Swallowing Func-Speech Pathology  Result Date: 07/19/2019 Objective Swallowing Evaluation: Type of Study: MBS-Modified Barium Swallow Study  Patient Details Name: Jonathan Flowers MRN: [id number] Date of Birth: May 24, 1960 Today's Date: 07/19/2019 Time: SLP Start Time (ACUTE ONLY): 1430 -SLP Stop Time (ACUTE ONLY): 1500 SLP Time Calculation (min) (ACUTE ONLY): 30 min Past Medical  History: Past Medical History: Diagnosis Date  HTN (hypertension)  Past Surgical History: No past surgical history on file. HPI:  59 y.o. man w/ acute onset headache with left sided weakness. CTH  shows right basal ganglia ICH measuring 5.4x2.9x4.0cm with 5.28mm of midline shift, volume estimated at 32-38cc.  Subjective: alert Assessment / Plan / Recommendation CHL IP CLINICAL IMPRESSIONS 07/19/2019 Clinical Impression Pt presents with a primary oral dysphagia with surprisingly reliable pharyngeal function and airway protection.  Oral phase was marked by left buccal residue, inefficient preparation and reduced bolus cohesion.  Pharyngeal swallow onset was functional for all consistencies.  There was consistently adequate laryngeal vestibule closure, with occasional high penetration of thin liquids (PAS 2), considered normal, no penetration of nectars, and one incident of trace aspiration of thins when swallowed in conjunction with regular solids.  There was no cough in response to this episode of aspiration.  There was notable intermittent backflow of dysphagia 1 solids to cervical esophagus. Given the majority of protective swallows, recommend allowing thin liquids; advance diet to dysphagia 2.  Pt will need ongoing supervision/assistance with feeding and to address left neglect, left pocketing, and precautions.   SLP Visit Diagnosis Dysphagia, oral phase (R13.11)  Attention and concentration deficit following -- Frontal lobe and executive function deficit following -- Impact on safety and function Mild aspiration risk   CHL IP TREATMENT RECOMMENDATION 07/19/2019 Treatment Recommendations Therapy as outlined in treatment plan below   Prognosis 07/19/2019 Prognosis for Safe Diet Advancement Good Barriers to Reach Goals -- Barriers/Prognosis Comment -- CHL IP DIET RECOMMENDATION 07/19/2019 SLP Diet Recommendations Dysphagia 2 (Fine chop) solids;Thin liquid Liquid Administration via Cup;Straw Medication Administration  Whole meds with puree Compensations Lingual sweep for clearance of pocketing Postural Changes --   CHL IP OTHER RECOMMENDATIONS 07/19/2019 Recommended Consults -- Oral Care Recommendations Oral care BID Other Recommendations Have oral suction available   CHL IP FOLLOW UP RECOMMENDATIONS 07/18/2019 Follow up Recommendations Inpatient Rehab   CHL IP FREQUENCY AND DURATION 07/19/2019 Speech Therapy Frequency (ACUTE ONLY) min 2x/week Treatment Duration 2 weeks      CHL IP ORAL PHASE 07/19/2019 Oral Phase Impaired Oral - Pudding Teaspoon -- Oral - Pudding Cup -- Oral - Honey Teaspoon -- Oral - Honey Cup -- Oral - Nectar Teaspoon -- Oral - Nectar Cup Weak lingual manipulation Oral - Nectar Straw -- Oral - Thin Teaspoon -- Oral - Thin Cup Decreased bolus cohesion Oral - Thin Straw -- Oral - Puree Delayed oral transit;Decreased bolus cohesion Oral - Mech Soft -- Oral - Regular Delayed oral transit;Decreased bolus cohesion;Left pocketing in lateral sulci Oral - Multi-Consistency -- Oral - Pill -- Oral Phase - Comment --  CHL IP PHARYNGEAL PHASE 07/19/2019 Pharyngeal Phase WFL Pharyngeal- Pudding Teaspoon -- Pharyngeal -- Pharyngeal- Pudding Cup -- Pharyngeal -- Pharyngeal- Honey Teaspoon -- Pharyngeal -- Pharyngeal- Honey Cup -- Pharyngeal -- Pharyngeal- Nectar Teaspoon -- Pharyngeal -- Pharyngeal- Nectar Cup -- Pharyngeal -- Pharyngeal- Nectar Straw -- Pharyngeal -- Pharyngeal- Thin Teaspoon -- Pharyngeal -- Pharyngeal- Thin Cup -- Pharyngeal -- Pharyngeal- Thin Straw -- Pharyngeal -- Pharyngeal- Puree -- Pharyngeal -- Pharyngeal- Mechanical Soft -- Pharyngeal -- Pharyngeal- Regular -- Pharyngeal -- Pharyngeal- Multi-consistency -- Pharyngeal -- Pharyngeal- Pill -- Pharyngeal -- Pharyngeal Comment --  No flowsheet data found. [name] 07/19/2019, 4:30 PM                PHYSICAL EXAM  Temp:  [98 F (36.7 C)-99.2 F (37.3 C)] 98.3 F (36.8 C) (06/15 1550) Pulse Rate:  [63-93] 81 (06/15 1550) Resp:  [15-35]  20 (06/15 1550) BP: (125-172)/(86-103) 149/88 (06/15 1550) SpO2:  [89 %-99 %] 96 % (06/15 1550)  General - Well nourished, well developed, in no apparent distress.Marland Kitchen  Ophthalmologic - fundi not visualized due to noncooperation.  Cardiovascular - Regular rhythm and rate.  Neuro - awake alert today, eyes open, orientated to place, age, time and people. Following all simple commands, able to repeat 3 word sentences, paucity of speech but able to speak short sentences without error, able to name 2/3. Left hemianopia, not blinking to visual threat on the left. Left asomatognosia. Eyes right gaze preference. PERRL. RUE and RLE 5/5 and LUE 0/5 and LLE 1/5 with pain stimulation. Sensation subjectively symmetrical, coordination not cooperative and gait not tested.    ASSESSMENT/PLAN Jonathan Flowers is a 60 y.o. male with history of HTN presenting with R gaze deviation and L sided flacidity. In ED developed less responsive state, HTN emergency and urine incontinence.   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   CTA head no LVO. Distal L A2 moderate/severe stenosis. Distal R A2 moderate stenosis.   MRI 6/7 Stable R basal ganglia ICH. L periatrial white matter infarct. Multiple scattered microhemorrhages w/ old L temporal lobe ICH. Small vessel disease. Atrophy.   CT head 6/10 unchanged R basal ganglia hemorrhage, increased edema. Mass effect and 21mm midline shift same. Small L parietal white matter infarct.    CT head 6/13 - stable hematoma and MLS at 60mm  2D Echo EF 55-60%. No source of embolus    EEG - 07/15/19 - moderate to severe diffuse encephalopathy, no seizures   LDL 117  HgbA1c 5.1  SCDs for VTE prophylaxis  No antithrombotic prior to admission, now on No antithrombotic given hemorrhage   Therapy recommendations:  CIR (not at CIR  level) ->SNF   Disposition:  pending   Cerebral Edema  CT/MRI w/ 33mm midline shift, unchanged  CT 6/13 MLS at 17mm  Neursurgery consult  (Ostergard) - no need for evacuation  off hypertonic saline   Na 141-143....155->150->151   Now on NS @ 75  Hypertensive Emergency  BP as high as 206/109  Home meds:  none  off cleviprex   prn labetalol and hydralazine  On norvasc 10mg  and Losartan 25->50   HCTZ DC'd due to increased Cre  Long-term BP goal normotensive  UTI  WBC 11.0->10.3 ......17.3->15.3->5.8->3.6->3.7  afebrile  Sputum culture showed Klebsiella pneumonie   Urine culture showed Citrobacter Koseri  Switch Zosyn to cefazolin 6/13>>6/15  Hyperlipidemia  Home meds:  No statin  LDL 117, goal < 70  Hold statin in setting of ICH  Consider statin on discharge  AKI   Cre 1.34->1.20->1.15......2.32->2.55->2.30->1.54->1.33  D/C HCTZ  Continue IV fluid  Encourage p.o. intake  Dysphagia  Secondary to stroke  On dysphagia 1 and honey thick->Dys 2 thin liquis  Continue IV fluid  Speech on board   Other Stroke Risk Factors    Other Active Problems  Lethargy -> amantadine trial started 6/11  Hospital day # 9   Rosalin Hawking, MD PhD Stroke Neurology 07/19/2019 4:49 PM  To contact Stroke Continuity provider, please refer to [email or web address] After hours, contact General Neurology

## 2019-07-19 NOTE — Progress Notes (Signed)
Physical Therapy Treatment Patient Details Name: Dyquan Minks MRN: [id number] DOB: December 16, 1960 Today's Date: 07/19/2019    History of Present Illness Siegfried Hassaan is an 59 y.o. male with a PMHx of HTN who presented to the [facility] ED as a code stroke for acute onset of right gaze deviation and left sided flaccid weakness. 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 Surrounding edema. Mass effect with right-to-left shift of 6 mm. Pt becomes more lethargic on 6/10, found to have UTI    PT Comments    Patient received in bed, brother present at beginning of session. Patient shows significant L neglect. Unable to attend to left side with cues or instruction. He requires max +2 assist for supine to sit and requires max assist to maintain sitting balance.  Unable to perform basic activities in sitting with instruction and assist. Posterior, right lateral lean in sitting. If uses right hand to assist sitting balance he pushes with his right hand until he is leaning over to his left. Unable to self correct. He was able to sit edge of bed for a few minutes and then returned himself to supine. Required mod assist to bring legs onto bed and max assist +2 to position properly in bed. He will continue to benefit from skilled PT to improve sitting balance.             Follow Up Recommendations  SNF per family request     Equipment Recommendations  Other (comment) (TBD)    Recommendations for Other Services       Precautions / Restrictions Precautions Precautions: Fall Restrictions Weight Bearing Restrictions: No Other Position/Activity Restrictions: pusher syndrome    Mobility  Bed Mobility Overal bed mobility: Needs Assistance Bed Mobility: Supine to Sit;Sit to Supine     Supine to sit: Max assist;HOB elevated;+2 for physical assistance Sit to supine: Mod assist;+2 for physical assistance   General bed mobility comments: MAX A to advance to  EOB to pts R side, pt reaching with RUE for rail, but required assist to scoot hips to EOB and elevate trunk. pt began to return self to supine from sitting EOB however required MOD A to return BLEs to supine. Max assist +2 for positioning in bed.  Transfers                 General transfer comment: defer d/t fatigue as pt began to return self to supine, not safe due to patient's very poor sitting balance  Ambulation/Gait             General Gait Details: unable   Stairs             Wheelchair Mobility    Modified Rankin (Stroke Patients Only) Modified Rankin (Stroke Patients Only) Pre-Morbid Rankin Score: No symptoms Modified Rankin: Severe disability     Balance Overall balance assessment: Needs assistance Sitting-balance support: Feet supported;No upper extremity supported Sitting balance-Leahy Scale: Poor Sitting balance - Comments: MAX A for sitting balance Postural control: Right lateral lean;Posterior lean   Standing balance-Leahy Scale: Zero                              Cognition Arousal/Alertness: Awake/alert Behavior During Therapy: WFL for tasks assessed/performed Overall Cognitive Status: Impaired/Different from baseline Area of Impairment: Problem solving;Awareness;Safety/judgement;Following commands;Attention  Current Attention Level: Selective Memory: Decreased recall of precautions;Decreased short-term memory Following Commands: Follows one step commands with increased time Safety/Judgement: Decreased awareness of safety Awareness: Intellectual Problem Solving: Slow processing;Requires verbal cues;Requires tactile cues        Exercises Total Joint Exercises Ankle Circles/Pumps: PROM;Both;10 reps Heel Slides: PROM;Both;10 reps Hip ABduction/ADduction: PROM;Both;10 reps Straight Leg Raises: PROM;Both;10 reps Other Exercises Other Exercises: attempted to orient pt to L side of body with little  success Other Exercises: Passive cervical rotation R/L seated EOB    General Comments General comments (skin integrity, edema, etc.): pts brother present during beginning of session. brother wrote down nieces number for RN to communicate with      Pertinent Vitals/Pain Pain Assessment: Faces Faces Pain Scale: No hurt    Home Living                      Prior Function            PT Goals (current goals can now be found in the care plan section) Acute Rehab PT Goals Patient Stated Goal: none stated PT Goal Formulation: With patient Time For Goal Achievement: 07/27/19 Progress towards PT goals: Progressing toward goals    Frequency    Min 4X/week      PT Plan Current plan remains appropriate    Co-evaluation PT/OT/SLP Co-Evaluation/Treatment: Yes Reason for Co-Treatment: Complexity of the patient's impairments (multi-system involvement);For patient/therapist safety;To address functional/ADL transfers PT goals addressed during session: Mobility/safety with mobility;Balance OT goals addressed during session: ADL's and self-care      AM-PAC PT "6 Clicks" Mobility   Outcome Measure  Help needed turning from your back to your side while in a flat bed without using bedrails?: Total Help needed moving from lying on your back to sitting on the side of a flat bed without using bedrails?: Total Help needed moving to and from a bed to a chair (including a wheelchair)?: Total Help needed standing up from a chair using your arms (e.g., wheelchair or bedside chair)?: Total Help needed to walk in hospital room?: Total Help needed climbing 3-5 steps with a railing? : Total 6 Click Score: 6    End of Session   Activity Tolerance: Patient limited by fatigue Patient left: in bed;with call bell/phone within reach;with bed alarm set Nurse Communication: Mobility status PT Visit Diagnosis: Other abnormalities of gait and mobility (R26.89);Muscle weakness (generalized)  (M62.81);Other symptoms and signs involving the nervous system (R29.898);Hemiplegia and hemiparesis Hemiplegia - Right/Left: Left Hemiplegia - dominant/non-dominant: Non-dominant Hemiplegia - caused by: Cerebral infarction     Time: 1210-1230 PT Time Calculation (min) (ACUTE ONLY): 20 min  Charges:                        [employer], PT, G[employer] 07/19/19,3:15 PM

## 2019-07-19 NOTE — Progress Notes (Signed)
Modified Barium Swallow Progress Note  Patient Details  Name: Jonathan Flowers MRN: [id number] Date of Birth: Sep 30, 1960  Today's Date: 07/19/2019  Modified Barium Swallow completed.  Full report located under Chart Review in the Imaging Section.  Brief recommendations include the following:  Clinical Impression  Pt presents with a primary oral dysphagia with surprisingly reliable pharyngeal function and airway protection.  Oral phase was marked by left buccal residue, inefficient preparation and reduced bolus cohesion.  Pharyngeal swallow onset was functional for all consistencies.  There was consistently adequate laryngeal vestibule closure, with occasional high penetration of thin liquids (PAS 2), considered normal, no penetration of nectars, and one incident of trace aspiration of thins when swallowed in conjunction with regular solids.  There was no cough in response to this episode of aspiration.  There was notable intermittent backflow of dysphagia 1 solids to cervical esophagus. Given the majority of protective swallows, recommend allowing thin liquids; advance diet to dysphagia 2.  Pt will need ongoing supervision/assistance with feeding and to address left neglect, left pocketing, and precautions.     Swallow Evaluation Recommendations       SLP Diet Recommendations: Dysphagia 2 (Fine chop) solids;Thin liquid   Liquid Administration via: Cup;Straw   Medication Administration: Whole meds with puree   Supervision: Staff to assist with self feeding   Compensations: Lingual sweep for clearance of pocketing       Oral Care Recommendations: Oral care BID   Other Recommendations: Have oral suction available   [name], MA CCC/SLP Acute Rehabilitation Services Office number [phone] Pager [phone]  [name] 07/19/2019,4:30 PM
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
## 2019-07-20 LAB — GLUCOSE, CAPILLARY
Glucose-Capillary: 92 mg/dL (ref 70–99)
Glucose-Capillary: 120 mg/dL — ABNORMAL HIGH (ref 70–99)
Glucose-Capillary: 132 mg/dL — ABNORMAL HIGH (ref 70–99)
Glucose-Capillary: 114 mg/dL — ABNORMAL HIGH (ref 70–99)
Glucose-Capillary: 119 mg/dL — ABNORMAL HIGH (ref 70–99)

## 2019-07-20 LAB — CBC
HCT: 42.9 % (ref 39.0–52.0)
Hemoglobin: 14.2 g/dL (ref 13.0–17.0)
MCH: 20.7 pg — ABNORMAL LOW (ref 26.0–34.0)
MCHC: 33.1 g/dL (ref 30.0–36.0)
MCV: 62.6 fL — ABNORMAL LOW (ref 80.0–100.0)
Platelets: 170 10*3/uL (ref 150–400)
RBC: 6.85 MIL/uL — ABNORMAL HIGH (ref 4.22–5.81)
RDW: 18.8 % — ABNORMAL HIGH (ref 11.5–15.5)
WBC: 4.5 10*3/uL (ref 4.0–10.5)
nRBC: 0 % (ref 0.0–0.2)

## 2019-07-20 LAB — BASIC METABOLIC PANEL
Anion gap: 7 (ref 5–15)
BUN: 21 mg/dL — ABNORMAL HIGH (ref 6–20)
CO2: 21 mmol/L — ABNORMAL LOW (ref 22–32)
Calcium: 8.5 mg/dL — ABNORMAL LOW (ref 8.9–10.3)
Chloride: 118 mmol/L — ABNORMAL HIGH (ref 98–111)
Creatinine, Ser: 1 mg/dL (ref 0.61–1.24)
GFR calc Af Amer: >60 mL/min (ref >60)
GFR calc non Af Amer: >60 mL/min (ref >60)
Glucose, Bld: 105 mg/dL — ABNORMAL HIGH (ref 70–99)
Potassium: 4 mmol/L (ref 3.5–5.1)
Sodium: 146 mmol/L — ABNORMAL HIGH (ref 135–145)

## 2019-07-20 MED ORDER — ADULT MULTIVITAMIN W/MINERALS CH
1.0000 | ORAL_TABLET | Freq: Every day | ORAL | Status: DC
  Administered 2019-07-20 – 2019-07-21 (×2): 1 via ORAL
  Filled 2019-07-20 (×2): qty 1

## 2019-07-20 MED ORDER — LOSARTAN POTASSIUM 50 MG PO TABS
50.0000 mg | ORAL_TABLET | Freq: Two times a day (BID) | ORAL | Status: DC
  Administered 2019-07-21: 50 mg via ORAL
  Filled 2019-07-20: qty 1

## 2019-07-20 MED ORDER — ENSURE ENLIVE PO LIQD
237.0000 mL | Freq: Three times a day (TID) | ORAL | Status: DC
  Administered 2019-07-20 – 2019-07-21 (×4): 237 mL via ORAL

## 2019-07-20 NOTE — TOC Progression Note (Signed)
Transition of Care [facility]) - Progression Note    Patient Details  Name: Jonathan Flowers MRN: [id number] Date of Birth: 10/16/1960  Transition of Care [facility]) CM/SW Contact  Pollie Friar, RN Phone Number: 07/20/2019, 10:25 AM  Clinical Narrative:    [name] in [place] is not in network with patients insurance so unable to offer a bed. Pts brother updated and provided choices that patients insurance is in network with in their area. Pts brother selected [facility] [place]. CM has faxed them the information and they are able to offer a bed and will begin insurance.  Pt has a 50% co pay for SNF until his deductible and out of pocket has been met. Pts brother is aware. They may also be responsible for the total cost of transportation to [facility]. CM has updated the brother that he will have to pay the cost of transport up front and then send the receipt to [facility] and see if they will reimburse any of the cost. Brother wanting to proceed with [facility] in [place].  Cindy with [facility] will begin insurance authorization for SNF rehab. CM will fax them the needed updated information.   Expected Discharge Plan: [place] Barriers to Discharge: Continued Medical Work up  Expected Discharge Plan and Services Expected Discharge Plan: [place] In-house Referral: Clinical Social Work Discharge Planning Services: CM Consult Post Acute Care Choice: [place] arrangements for the past 2 months: Single Family Home                                       Social Determinants of Health (SDOH) Interventions    Readmission Risk Interventions No flowsheet data found.

## 2019-07-20 NOTE — Progress Notes (Signed)
STROKE TEAM PROGRESS NOTE   INTERVAL HISTORY   No family at bedside. Pt lying in bed, awake alert, orientated, still has left hemiplegia. Pending placement.  Vitals:   07/19/19 1550 07/19/19 1930 07/19/19 2308 07/20/19 0339  BP: (!) 149/88 (!) 152/95 (!) 156/86 (!) 151/95  Pulse: 81  80 75  Resp: 20  15 17   Temp: 98.3 F (36.8 C) 98.6 F (37 C) 98.2 F (36.8 C) 97.7 F (36.5 C)  TempSrc: Axillary Oral Oral Oral  SpO2: 96%  99% 96%  Weight:      Height:       CBC:  Recent Labs  Lab 07/19/19 0521 07/20/19 0725  WBC 3.7* 4.5  HGB 13.3 14.2  HCT 40.8 42.9  MCV 62.9* 62.6*  PLT 162 154   Basic Metabolic Panel:  Recent Labs  Lab 07/19/19 0521 07/20/19 0725  NA 151* 146*  K 3.8 4.0  CL 119* 118*  CO2 23 21*  GLUCOSE 121* 105*  BUN 24* 21*  CREATININE 1.33* 1.00  CALCIUM 8.6* 8.5*   Lipid Panel:     Component Value Date/Time   CHOL 198 07/10/2019 1256   TRIG 124 07/10/2019 1256   HDL 56 07/10/2019 1256   CHOLHDL 3.5 07/10/2019 1256   VLDL 25 07/10/2019 1256   LDLCALC 117 (H) 07/10/2019 1256   HgbA1c:  Lab Results  Component Value Date   HGBA1C 5.1 07/10/2019   Urine Drug Screen:     Component Value Date/Time   LABOPIA NONE DETECTED 07/10/2019 1800   COCAINSCRNUR NONE DETECTED 07/10/2019 1800   LABBENZ NONE DETECTED 07/10/2019 1800   AMPHETMU NONE DETECTED 07/10/2019 1800   THCU NONE DETECTED 07/10/2019 1800   LABBARB NONE DETECTED 07/10/2019 1800    Alcohol Level     Component Value Date/Time   ETH <10 07/10/2019 1256    IMAGING past 24 hours No results found.   PHYSICAL EXAM  Temp:  [97.7 F (36.5 C)-98.6 F (37 C)] 97.7 F (36.5 C) (06/16 0339) Pulse Rate:  [75-81] 75 (06/16 0339) Resp:  [15-20] 17 (06/16 0339) BP: (149-156)/(86-95) 151/95 (06/16 0339) SpO2:  [96 %-99 %] 96 % (06/16 0339)  General - Well nourished, well developed, in no apparent distress.Marland Kitchen  Ophthalmologic - fundi not visualized due to  noncooperation.  Cardiovascular - Regular rhythm and rate.  Neuro - awake alert, eyes open, orientated to place, age, time and people. Following all simple commands, able to repeat 3 word sentences, paucity of speech but able to speak short sentences without error, able to name 2/3. Left hemianopia, not blinking to visual threat on the left. Left asomatognosia. Eyes right gaze preference. PERRL. RUE and RLE 5/5 and LUE 0/5 and LLE 1/5 with pain stimulation. Sensation subjectively symmetrical, coordination not cooperative and gait not tested.    ASSESSMENT/PLAN [name] is a 59 y.o. male with history of HTN presenting with R gaze deviation and L sided flacidity. In ED developed less responsive state, HTN emergency and urine incontinence.   ICH:  R BG ICH with cerebral edema  Stroke: L MCA periventricular infarct, likely secondary to small vessel disease source  CT head x 2 R basal ganglia ICH w/ mass effect and 65mm R midline shift.   CTA head no LVO. Distal L A2 moderate/severe stenosis. Distal R A2 moderate stenosis.   MRI 6/7 Stable R basal ganglia ICH. L periatrial white matter infarct. Multiple scattered microhemorrhages w/ old L temporal lobe ICH. Small vessel disease. Atrophy.  CT head 6/10 unchanged R basal ganglia hemorrhage, increased edema. Mass effect and 44mm midline shift same. Small L parietal white matter infarct.    CT head 6/13 - stable hematoma and MLS at 44mm  2D Echo EF 55-60%. No source of embolus    EEG - 07/15/19 - moderate to severe diffuse encephalopathy, no seizures   LDL 117  HgbA1c 5.1  SCDs for VTE prophylaxis  No antithrombotic prior to admission, now on No antithrombotic given hemorrhage   Therapy recommendations:  CIR (not at CIR level) ->SNF   Disposition:  pending   Cerebral Edema  CT/MRI w/ 12mm midline shift, unchanged  CT 6/13 MLS at 53mm  Neursurgery consult  ([place]stergard) - no need for evacuation  off hypertonic saline   Na  141-143....155->150->151->146  Hypertensive Emergency  BP as high as 206/109  Home meds:  none  off cleviprex   prn labetalol and hydralazine  BP at high end . On norvasc 10mg  and Losartan 25->50 -> 50 bid . HCTZ DC'd due to increased Cre . Long-term BP goal normotensive  UTI  WBC 11.0->10.3 ......17.3->15.3->5.8->3.6->3.7->4.5  afebrile  Sputum culture showed Klebsiella pneumonie   Urine culture showed Citrobacter Koseri  Switch Zosyn to cefazolin 6/13>>6/15  Hyperlipidemia  Home meds:  No statin  LDL 117, goal < 70  Hold statin in setting of ICH  Consider statin on discharge  AKI   Cre 1.34->1.20->1.15......2.32->2.55->2.30->1.54->1.33->1.00  D/C HCTZ  Continue IV fluid -> d/c'ed  Encourage p.o. intake  Dysphagia . Secondary to stroke . On dysphagia 1 and honey thick->Dys 2 thin liquis . off IV fluid . Speech on board   Other Stroke Risk Factors    Other Active Problems  Lethargy -> amantadine trial started 6/11  Hospital day # 10   Neurology will sign off. Please call with questions. Pt will follow up with stroke clinic NP at [facility] in about 4 weeks. Thanks for the consult.   [facility], MD PhD Stroke Neurology 07/20/2019 3:22 PM  To contact Stroke Continuity provider, please refer to 07/22/2019. After hours, contact General Neurology

## 2019-07-20 NOTE — Progress Notes (Signed)
°  Speech Language Pathology Treatment: Dysphagia  Patient Details Name: Jonathan Flowers MRN: [id number] DOB: 1960/09/29 Today's Date: 07/20/2019 Time: 7209-4709 SLP Time Calculation (min) (ACUTE ONLY): 15 min  Assessment / Plan / Recommendation Clinical Impression  Pt participatory and eager to eat this am.  Reviewed basic results and recs from yesterday's MBS.  Repositioned in bed to optimize safety/participation.  Pt required visual/verbal/tactile cues to attend to midline for self-feeding and to check for pocketing in left buccal cavity.  He consumed portion of bfast and drank thin water from a cup with no overt s/s of aspiration, verbal cues ~50% of time to slow rate and monitor bolus size.  Pt more animated and voice stronger today. SLP will continue to follow for swallowing/cognition.   HPI HPI:  59 y.o. man w/ acute onset headache with left sided weakness. CTH  shows right basal ganglia ICH measuring 5.4x2.9x4.0cm with 5.13mm of midline shift, volume estimated at 32-38cc.      SLP Plan  Continue with current plan of care       Recommendations  Diet recommendations: Dysphagia 2 (fine chop);Thin liquid Liquids provided via: Cup;Straw Medication Administration: Crushed with puree Supervision: Staff to assist with self feeding;Full supervision/cueing for compensatory strategies Compensations: Lingual sweep for clearance of pocketing Postural Changes and/or Swallow Maneuvers: Seated upright 90 degrees                Oral Care Recommendations: Oral care BID SLP Visit Diagnosis: Dysphagia, oral phase (R13.11) Plan: Continue with current plan of care       GO              Kaelin Bonelli L. Samson Frederic, MA CCC/SLP Acute Rehabilitation Services Office number [phone] Pager [phone]   [name] 07/20/2019, 12:24 PM

## 2019-07-20 NOTE — Progress Notes (Signed)
Initial Nutrition Assessment  DOCUMENTATION CODES:   Not applicable  INTERVENTION:  Ensure Enlive po TID, each supplement provides 350 kcal and 20 grams of protein  MVI daily  Magic cup TID with meals, each supplement provides 290 kcal and 9 grams of protein  NUTRITION DIAGNOSIS:   Inadequate oral intake related to poor appetite as evidenced by meal completion < 50%.    GOAL:   Patient will meet greater than or equal to 90% of their needs    MONITOR:   PO intake, Supplement acceptance, Weight trends, Labs, I & O's, Diet advancement  REASON FOR ASSESSMENT:   LOS    ASSESSMENT:   Pt admitted with ICH and L MCA infarct. PMH significant for HTN.  6/9 Dysphagia 1 diet with honey thick liquids 6/15 diet upgraded to dysphagia 2 diet with thin liquids  Pt unavailable for RD exam.  No weight history available for review.   PO Intake: 0-50% x last 8 recorded meals (28% average meal intake)  Labs: Na 146 (H) CBGs 92-156 Medications: Novolog, Miralax, Senokot-S IVF: NS @ 9ml/hr  UOP: 1,466ml x24 hours I/O: -2,559.78ml since admit  NUTRITION - FOCUSED PHYSICAL EXAM:  Unable to perform at this time, will attempt at follow-up.  Diet Order:   Diet Order            DIET DYS 2 Room service appropriate? Yes; Fluid consistency: Thin  Diet effective now                 EDUCATION NEEDS:   No education needs have been identified at this time  Skin:  Skin Assessment: Reviewed RN Assessment  Last BM:  6/15 type 5  Height:   Ht Readings from Last 1 Encounters:  07/10/19 5\' 4"  (1.626 m)    Weight:   Wt Readings from Last 1 Encounters:  07/10/19 71.4 kg    BMI:  Body mass index is 27.02 kg/m.  Estimated Nutritional Needs:   Kcal:  1800-2000  Protein:  85-95 grams  Fluid:  >1.8L/d    09/09/19, MS, RD, LDN RD pager number and weekend/on-call pager number located in Amion.
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
## 2019-07-21 LAB — BASIC METABOLIC PANEL
Anion gap: 10 (ref 5–15)
BUN: 25 mg/dL — ABNORMAL HIGH (ref 6–20)
CO2: 22 mmol/L (ref 22–32)
Calcium: 8.9 mg/dL (ref 8.9–10.3)
Chloride: 114 mmol/L — ABNORMAL HIGH (ref 98–111)
Creatinine, Ser: 1.1 mg/dL (ref 0.61–1.24)
GFR calc Af Amer: >60 mL/min (ref >60)
GFR calc non Af Amer: >60 mL/min (ref >60)
Glucose, Bld: 135 mg/dL — ABNORMAL HIGH (ref 70–99)
Potassium: 3.9 mmol/L (ref 3.5–5.1)
Sodium: 146 mmol/L — ABNORMAL HIGH (ref 135–145)

## 2019-07-21 LAB — CBC
HCT: 41.3 % (ref 39.0–52.0)
Hemoglobin: 13.7 g/dL (ref 13.0–17.0)
MCH: 20.4 pg — ABNORMAL LOW (ref 26.0–34.0)
MCHC: 33.2 g/dL (ref 30.0–36.0)
MCV: 61.4 fL — ABNORMAL LOW (ref 80.0–100.0)
Platelets: 181 10*3/uL (ref 150–400)
RBC: 6.73 MIL/uL — ABNORMAL HIGH (ref 4.22–5.81)
RDW: 18.3 % — ABNORMAL HIGH (ref 11.5–15.5)
WBC: 5.2 10*3/uL (ref 4.0–10.5)
nRBC: 0 % (ref 0.0–0.2)

## 2019-07-21 LAB — SARS CORONAVIRUS 2 BY RT PCR (HOSPITAL ORDER, PERFORMED IN ~~LOC~~ HOSPITAL LAB): SARS Coronavirus 2: NEGATIVE

## 2019-07-21 LAB — GLUCOSE, CAPILLARY
Glucose-Capillary: 116 mg/dL — ABNORMAL HIGH (ref 70–99)
Glucose-Capillary: 123 mg/dL — ABNORMAL HIGH (ref 70–99)

## 2019-07-21 MED ORDER — AMANTADINE HCL 100 MG PO CAPS: 100.0000 mg | ORAL_CAPSULE | Freq: Two times a day (BID) | ORAL | Status: AC

## 2019-07-21 MED ORDER — PANTOPRAZOLE SODIUM 40 MG PO TBEC: 40.0000 mg | DELAYED_RELEASE_TABLET | Freq: Every day | ORAL | Status: AC

## 2019-07-21 MED ORDER — POLYETHYLENE GLYCOL 3350 17 G PO PACK: 17.0000 g | PACK | Freq: Every day | ORAL | 0 refills | Status: AC

## 2019-07-21 MED ORDER — LOSARTAN POTASSIUM 50 MG PO TABS: 50.0000 mg | ORAL_TABLET | Freq: Two times a day (BID) | ORAL | Status: AC

## 2019-07-21 MED ORDER — ATORVASTATIN CALCIUM 40 MG PO TABS
40.0000 mg | ORAL_TABLET | Freq: Every day | ORAL | Status: DC
  Administered 2019-07-21: 40 mg via ORAL
  Filled 2019-07-21: qty 1

## 2019-07-21 MED ORDER — ADULT MULTIVITAMIN W/MINERALS CH: 1.0000 | ORAL_TABLET | Freq: Every day | ORAL | Status: AC

## 2019-07-21 MED ORDER — SENNOSIDES-DOCUSATE SODIUM 8.6-50 MG PO TABS: 1.0000 | ORAL_TABLET | Freq: Two times a day (BID) | ORAL | Status: AC

## 2019-07-21 MED ORDER — AMLODIPINE BESYLATE 10 MG PO TABS: 10.0000 mg | ORAL_TABLET | Freq: Every day | ORAL | Status: AC

## 2019-07-21 MED ORDER — ENSURE ENLIVE PO LIQD: 237.0000 mL | Freq: Three times a day (TID) | ORAL | 12 refills | Status: AC

## 2019-07-21 MED ORDER — ATORVASTATIN CALCIUM 40 MG PO TABS: 40.0000 mg | ORAL_TABLET | Freq: Every day | ORAL | Status: AC

## 2019-07-21 MED ORDER — HYDRALAZINE HCL 25 MG PO TABS
25.0000 mg | ORAL_TABLET | Freq: Four times a day (QID) | ORAL | 11 refills | Status: AC | PRN
Start: 2019-07-21 — End: 2020-07-20

## 2019-07-21 NOTE — Progress Notes (Signed)
Report called and given to nurse Morrie Sheldon of [facility] [place]. Pt awaiting pickup from [facility].

## 2019-07-21 NOTE — Progress Notes (Signed)
Physical Therapy Treatment Patient Details Name: Jonathan Flowers MRN: [id number] DOB: 25-Dec-1960 Today's Date: 07/21/2019    History of Present Illness Jonathan Flowers is an 59 y.o. male with a PMHx of HTN who presented to the [facility] ED as a code stroke for acute onset of right gaze deviation and left sided flaccid weakness. 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 Surrounding edema. Mass effect with right-to-left shift of 6 mm. Pt becomes more lethargic on 6/10, found to have UTI    PT Comments    Pt progressing slowly towards physical therapy goals, agreeable to participate. Requiring two person maximal assist for bed mobility. Session focused on static/dynamic sitting balance, tactile facilitation of cervical/thoracic extension, encouraging left sided attention and use of visual cue of mirror to promote midline positioning. Pt continues with L hemiparesis, poor sitting balance, cognitive impairments and left neglect. Continue to recommend SNF for ongoing Physical Therapy.       Follow Up Recommendations  SNF     Equipment Recommendations  Other (comment) (defer)    Recommendations for Other Services       Precautions / Restrictions Precautions Precautions: Fall Precaution Comments: Pusher syndrome Restrictions Weight Bearing Restrictions: No    Mobility  Bed Mobility Overal bed mobility: Needs Assistance Bed Mobility: Rolling;Sidelying to Sit;Sit to Supine Rolling: Max assist Sidelying to sit: Max assist;+2 for physical assistance   Sit to supine: Max assist;+2 for physical assistance   General bed mobility comments: Rolling to left with maxA, guidance for RUE to reach for railing, cues for turning to look towards left to facilitate. MaxA + 2 for supine <> sit exiting towards left side of bed  Transfers                    Ambulation/Gait                 Stairs             Wheelchair Mobility     Modified Rankin (Stroke Patients Only) Modified Rankin (Stroke Patients Only) Pre-Morbid Rankin Score: No symptoms Modified Rankin: Severe disability     Balance Overall balance assessment: Needs assistance Sitting-balance support: Feet supported;No upper extremity supported Sitting balance-Leahy Scale: Zero Sitting balance - Comments: maxA for sitting balance, posterior lean, increased cervical flexion, and left lateral lean Postural control: Right lateral lean;Posterior lean                                  Cognition Arousal/Alertness: Awake/alert Behavior During Therapy: WFL for tasks assessed/performed Overall Cognitive Status: Impaired/Different from baseline Area of Impairment: Problem solving;Awareness;Safety/judgement;Following commands;Attention                   Current Attention Level: Selective Memory: Decreased recall of precautions;Decreased short-term memory Following Commands: Follows one step commands with increased time Safety/Judgement: Decreased awareness of safety Awareness: Intellectual Problem Solving: Slow processing;Requires verbal cues;Requires tactile cues        Exercises General Exercises - Lower Extremity Long Arc Quad: Right;5 reps;Seated Other Exercises Other Exercises: Sitting: left lateral leans onto forearm x 5, tactile facilitation for cervical/thoracic extension    General Comments        Pertinent Vitals/Pain Pain Assessment: No/denies pain    Home Living  Prior Function            PT Goals (current goals can now be found in the care plan section) Acute Rehab PT Goals Patient Stated Goal: none stated Potential to Achieve Goals: Good Progress towards PT goals: Progressing toward goals    Frequency    Min 3X/week      PT Plan Frequency needs to be updated    Co-evaluation              AM-PAC PT "6 Clicks" Mobility   Outcome Measure  Help needed turning  from your back to your side while in a flat bed without using bedrails?: Total Help needed moving from lying on your back to sitting on the side of a flat bed without using bedrails?: Total Help needed moving to and from a bed to a chair (including a wheelchair)?: Total Help needed standing up from a chair using your arms (e.g., wheelchair or bedside chair)?: Total Help needed to walk in hospital room?: Total Help needed climbing 3-5 steps with a railing? : Total 6 Click Score: 6    End of Session   Activity Tolerance: Other (comment) (limited by dizziness) Patient left: in bed;with call bell/phone within reach;with bed alarm set Nurse Communication: Mobility status PT Visit Diagnosis: Other abnormalities of gait and mobility (R26.89);Muscle weakness (generalized) (M62.81);Other symptoms and signs involving the nervous system (R29.898);Hemiplegia and hemiparesis Hemiplegia - Right/Left: Left Hemiplegia - dominant/non-dominant: Non-dominant Hemiplegia - caused by: Cerebral infarction     Time: 2202-5427 PT Time Calculation (min) (ACUTE ONLY): 17 min  Charges:  $Neuromuscular Re-education: 8-22 mins                       Wyona Almas, PT, DPT Acute Rehabilitation Services Pager [phone] Office [phone]    Deno Etienne 07/21/2019, 3:56 PM

## 2019-07-21 NOTE — Discharge Summary (Addendum)
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 pressures. Right Ventricle: The right ventricular size is normal. No increase in right ventricular wall thickness. Right ventricular systolic function is normal. There is normal pulmonary artery systolic pressure. The tricuspid regurgitant velocity is 2.55 m/s, and  with an assumed right atrial pressure of 3 mmHg, the estimated right ventricular systolic pressure is 29.0 mmHg. Left Atrium: Left atrial size was normal in size. Right Atrium: Right atrial size was normal in size. Pericardium: There is no evidence of pericardial effusion. Mitral Valve: The mitral valve is abnormal. There is mild thickening of the mitral valve leaflet(s). Trivial mitral valve regurgitation. Tricuspid Valve: The tricuspid valve is grossly normal. Tricuspid valve regurgitation is trivial. Aortic Valve: The aortic valve is tricuspid. Aortic valve regurgitation is not visualized. Pulmonic Valve: The pulmonic valve was normal in structure. Pulmonic valve regurgitation is not visualized. Aorta: Aortic dilatation noted. There is mild dilatation at the level of the sinuses of Valsalva measuring 40 mm. Venous: The inferior vena cava is normal in size with greater than 50% respiratory variability, suggesting right atrial pressure of 3 mmHg. IAS/Shunts: No atrial level shunt  detected by color flow Doppler.  LEFT VENTRICLE PLAX 2D LVIDd:         5.52 cm  Diastology LVIDs:         4.14 cm  LV e' lateral:   5.00 cm/s LV PW:         1.18 cm  LV E/e' lateral: 14.1 LV IVS:        1.14 cm  LV e' medial:    5.00 cm/s LVOT diam:     2.10 cm  LV E/e' medial:  14.1 LV SV:         57 LV SV Index:   32 LVOT Area:     3.46 cm  RIGHT VENTRICLE             IVC RV Basal diam:  2.62 cm     IVC diam: 1.93 cm RV S prime:     13.20 cm/s TAPSE (M-mode): 2.3 cm LEFT ATRIUM             Index       RIGHT ATRIUM  Index LA diam:        3.70 cm 2.09 cm/m  RA Area:     16.10 cm LA Vol (A2C):   71.1 ml 40.24 ml/m RA Volume:   36.00 ml  20.37 ml/m LA Vol (A4C):   46.4 ml 26.26 ml/m LA Biplane Vol: 57.4 ml 32.48 ml/m  AORTIC VALVE LVOT Vmax:   75.40 cm/s LVOT Vmean:  50.000 cm/s LVOT VTI:    0.164 m  AORTA Ao Root diam: 4.00 cm Ao Asc diam:  3.60 cm MITRAL VALVE               TRICUSPID VALVE MV Area (PHT): 2.91 cm    TR Peak grad:   26.0 mmHg MV Decel Time: 261 msec    TR Vmax:        255.00 cm/s MV E velocity: 70.30 cm/s MV A velocity: 78.10 cm/s  SHUNTS MV E/A ratio:  0.90        Systemic VTI:  0.16 m                            Systemic Diam: 2.10 cm Zoila Shutter MD Electronically signed by Zoila Shutter MD Signature Date/Time: 07/11/2019/4:28:41 PM    Final    CT HEAD CODE STROKE WO CONTRAST  Result Date: 07/10/2019 CLINICAL DATA:  Code stroke. Sudden onset of headache, nausea and vomiting. Hypertension. EXAM: CT HEAD WITHOUT CONTRAST TECHNIQUE: Contiguous axial images were obtained from the base of the skull through the vertex without intravenous contrast. COMPARISON:  None. FINDINGS: Brain: Acute intraparenchymal hemorrhage in the right basal ganglia and radiating white matter tracts measuring 5.4 x 4.5 x 3.0 (volume = 38) cm. No intraventricular penetration at this time. Mass effect with flattening of the right lateral ventricle and right-to-left shift of 6 mm. Chronic small-vessel ischemic  changes elsewhere affect the cerebral hemispheric white matter. No sign of acute cortical infarction. No mass lesion, hydrocephalus or extra-axial collection. Vascular: There is atherosclerotic calcification of the major vessels at the base of the brain. Skull: Negative Sinuses/Orbits: Clear/normal Other: None IMPRESSION: 1. Acute intraparenchymal hemorrhage affecting the right basal ganglia and regional white matter tracks. Volume 38 cc. Mass effect with right-to-left shift of 6 mm. These results were communicated to Dr. Otelia Limes at 10:19 amon 6/6/2021by text page via the [facility] messaging system. Electronically Signed   By: Paulina Fusi M.D.   On: 07/10/2019 10:20   VAS US CAROTID  Result Date: 07/13/2019 Carotid Arterial Duplex Study Indications:       CVA. Risk Factors:      Hypertension. Other Factors:     Right basal ganglia hemorrhage. Comparison Study:  none Performing Technologist: Jeb Levering RDMS, RVT  Examination Guidelines: A complete evaluation includes B-mode imaging, spectral Doppler, color Doppler, and power Doppler as needed of all accessible portions of each vessel. Bilateral testing is considered an integral part of a complete examination. Limited examinations for reoccurring indications may be performed as noted.  Right Carotid Findings: +----------+--------+--------+--------+------------------+--------+           PSV cm/sEDV cm/sStenosisPlaque DescriptionComments +----------+--------+--------+--------+------------------+--------+ CCA Prox  101     17                                         +----------+--------+--------+--------+------------------+--------+ CCA Distal90      14                                         +----------+--------+--------+--------+------------------+--------+  ICA Prox  59      15      1-39%   heterogenous               +----------+--------+--------+--------+------------------+--------+ ICA Distal99      21                                          +----------+--------+--------+--------+------------------+--------+ ECA       128     14                                         +----------+--------+--------+--------+------------------+--------+ +----------+--------+-------+----------------+-------------------+           PSV cm/sEDV cmsDescribe        Arm Pressure (mmHG) +----------+--------+-------+----------------+-------------------+ [id number]            Multiphasic, WNL                    +----------+--------+-------+----------------+-------------------+ +---------+--------+--+--------+-+---------+ VertebralPSV cm/s29EDV cm/s9Antegrade +---------+--------+--+--------+-+---------+  Left Carotid Findings: +----------+--------+--------+--------+------------------+--------+           PSV cm/sEDV cm/sStenosisPlaque DescriptionComments +----------+--------+--------+--------+------------------+--------+ CCA Prox  108     19                                         +----------+--------+--------+--------+------------------+--------+ CCA Distal86      17                                         +----------+--------+--------+--------+------------------+--------+ ICA Prox  68      20      1-39%   homogeneous                +----------+--------+--------+--------+------------------+--------+ ICA Distal79      20                                         +----------+--------+--------+--------+------------------+--------+ ECA       116     11                                         +----------+--------+--------+--------+------------------+--------+ +----------+--------+--------+----------------+-------------------+           PSV cm/sEDV cm/sDescribe        Arm Pressure (mmHG) +----------+--------+--------+----------------+-------------------+ [id number]             Multiphasic, WNL                    +----------+--------+--------+----------------+-------------------+  +---------+--------+--+--------+--+---------+ VertebralPSV cm/s60EDV cm/s15Antegrade +---------+--------+--+--------+--+---------+   Summary: Right Carotid: Velocities in the right ICA are consistent with a 1-39% stenosis. Left Carotid: Velocities in the left ICA are consistent with a 1-39% stenosis. Vertebrals:  Bilateral vertebral arteries demonstrate antegrade flow. Subclavians: Normal flow hemodynamics were seen in bilateral subclavian              arteries. *See table(s) above for measurements and observations.  Electronically signed by Delia Heady MD on  07/13/2019 at 1:26:48 PM.    Final    CT ANGIO HEAD CODE STROKE  Result Date: 07/10/2019 CLINICAL DATA:  Parenchymal hemorrhage, follow-up. EXAM: CT ANGIOGRAPHY HEAD TECHNIQUE: Multidetector CT imaging of the head was performed using the standard protocol during bolus administration of intravenous contrast. Multiplanar CT image reconstructions and MIPs were obtained to evaluate the vascular anatomy. CONTRAST:  50mL OMNIPAQUE IOHEXOL 350 MG/ML SOLN COMPARISON:  Noncontrast head CT performed earlier the same day. FINDINGS: CT HEAD Brain: Again demonstrated is an acute parenchymal hemorrhage centered within the right basal ganglia and radiating white matter tracks. The hemorrhage has not significant changed in size, again measuring 5.7 x 3.1 x 4.5 cm (AP x TV x CC) (remeasured on prior). Surrounding edema appears subtly increased. As before, there is significant effacement of the right lateral ventricle. Unchanged 6 mm leftward midline shift. No intraventricular extension of hemorrhage at this time. No evidence of acute demarcated cortical infarction. Elsewhere, there are stable chronic small vessel ischemic changes. No evidence of intracranial mass, hydrocephalus or extra-axial fluid collection. Vascular: Reported below. Skull: Normal. Negative for fracture or focal lesion. Sinuses: Mild ethmoid and right maxillary sinus mucosal thickening. Orbits: No  acute abnormality. CTA HEAD Anterior circulation: The intracranial internal carotid arteries are patent without significant stenosis. Mild scattered calcified plaque within these vessels. The M1 middle cerebral arteries are patent without significant stenosis. No M2 proximal branch occlusion or high-grade proximal stenosis is identified. The anterior cerebral arteries are patent bilaterally. There is a moderate/severe focal stenosis within the distal A2 left anterior cerebral artery. Moderate focal stenosis within the distal A2 right anterior cerebral artery. Additional sclerotic irregularity of the distal anterior cerebral arteries bilaterally. No appreciable aneurysm or arteriovenous malformation. Posterior circulation: The non dominant intracranial right vertebral artery is developmentally diminutive but patent and appears to terminate as the right PICA. The dominant intracranial left vertebral artery is patent without significant stenosis. The posterior cerebral arteries are patent bilaterally. The posterior cerebral arteries are patent bilaterally without high-grade proximal stenosis. Posterior communicating arteries are hypoplastic or absent bilaterally. Venous sinuses: The venous sinuses are poorly assessed due to contrast timing Anatomic variants: As described IMPRESSION: CT head: 1. Unchanged size of a 5.7 cm acute parenchymal hemorrhage centered within the right basal ganglia and radiating white matter tracks. Surrounding edema has slightly increased. Mass effect has not significantly changed. As before, there is significant partial effacement of the right lateral ventricle with 6 mm leftward midline shift. 2. No evidence of intraventricular extension of hemorrhage at this time. No hydrocephalus. CTA head: 1. No intracranial large vessel occlusion. 2. 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 3. Moderate focal stenosis within the distal A2 right anterior cerebral  artery. 4. No appreciable intracranial aneurysm or AVM. Electronically Signed   By: Jackey Loge DO   On: 07/10/2019 18:58   (Echo, Carotid, EGD, Colonoscopy, ERCP)    Subjective: Patient seen and examined.  He is aware about going to a skilled nursing facility.  He is asking me to help him get out of the bed and walk.  I explained to him that he will still need more than 1 person support and will need long-term rehab.  He is agreeable.  No other overnight events.  His voice is more clear and is able to eat without problem.   Discharge Exam: Vitals:   07/21/19 0600 07/21/19 0800  BP:    Pulse: 70   Resp: 16   Temp:  97.6 F (36.4 C)  SpO2: 99%    Vitals:   07/21/19 0100 07/21/19 0306 07/21/19 0600 07/21/19 0800  BP:  (!) 135/99    Pulse: 88 78 70   Resp: 20 16 16    Temp:  (!) 97.5 F (36.4 C)  97.6 F (36.4 C)  TempSrc:  Axillary  Oral  SpO2: 100% 100% 99%   Weight:      Height:        General: Pt is alert, awake, not in acute distress Cardiovascular: RRR, S1/S2 +, no rubs, no gallops Respiratory: CTA bilaterally, no wheezing, no rhonchi Abdominal: Soft, NT, ND, bowel sounds + Extremities: no edema, no cyanosis Neuro exam: Cranial nerves, left hemineglect, right gaze deviation Alert oriented x4, follows commands. Left upper extremity and left lower extremity flaccid paralysis, 0/5.   The results of significant diagnostics from this hospitalization (including imaging, microbiology, ancillary and laboratory) are listed below for reference.     Microbiology: Recent Results (from the past 240 hour(s))  Culture, blood (routine x 2)     Status: None   Collection Time: 07/14/19  8:47 PM   Specimen: BLOOD  Result Value Ref Range Status   Specimen Description BLOOD LEFT ARM  Final   Special Requests   Final    BOTTLES DRAWN AEROBIC AND ANAEROBIC Blood Culture adequate volume   Culture   Final    NO GROWTH 5 DAYS Performed at [facility], [address],  [place], [place] [address]    Report Status 07/19/2019 FINAL  Final  Culture, blood (routine x 2)     Status: Abnormal   Collection Time: 07/14/19  8:53 PM   Specimen: BLOOD  Result Value Ref Range Status   Specimen Description BLOOD RIGHT ARM  Final   Special Requests   Final    BOTTLES DRAWN AEROBIC AND ANAEROBIC Blood Culture adequate volume   Culture  Setup Time   Final    IN BOTH AEROBIC AND ANAEROBIC BOTTLES GRAM POSITIVE COCCI IN CLUSTERS Organism ID to follow CRITICAL RESULT CALLED TO, READ BACK BY AND VERIFIED WITH: E MARTIN PHARMD 07/15/19 2105 JDW    Culture (A)  Final    STAPHYLOCOCCUS SPECIES (COAGULASE NEGATIVE) THE SIGNIFICANCE OF ISOLATING THIS ORGANISM FROM A SINGLE SET OF BLOOD CULTURES WHEN MULTIPLE SETS ARE DRAWN IS UNCERTAIN. PLEASE NOTIFY THE MICROBIOLOGY DEPARTMENT WITHIN ONE WEEK IF SPECIATION AND SENSITIVITIES ARE REQUIRED. Performed at [facility], [address] [place], [place] [address]    Report Status 07/17/2019 FINAL  Final  Blood Culture ID Panel (Reflexed)     Status: Abnormal   Collection Time: 07/14/19  8:53 PM  Result Value Ref Range Status   Enterococcus species NOT DETECTED NOT DETECTED Final   Listeria monocytogenes NOT DETECTED NOT DETECTED Final   Staphylococcus species DETECTED (A) NOT DETECTED Final    Comment: Methicillin (oxacillin) resistant coagulase negative staphylococcus. Possible blood culture contaminant (unless isolated from more than one blood culture draw or clinical case suggests pathogenicity). No antibiotic treatment is indicated for blood  culture contaminants. CRITICAL RESULT CALLED TO, READ BACK BY AND VERIFIED WITH: E MARTIN PHARMD 07/15/19 2105 JDW    Staphylococcus aureus (BCID) NOT DETECTED NOT DETECTED Final   Methicillin resistance DETECTED (A) NOT DETECTED Final    Comment: CRITICAL RESULT CALLED TO, READ BACK BY AND VERIFIED WITH: E MARTIN PHARMD 07/15/19 2105 JDW    Streptococcus species NOT DETECTED NOT  DETECTED Final   Streptococcus agalactiae  NOT DETECTED NOT DETECTED Final   Streptococcus pneumoniae NOT DETECTED NOT DETECTED Final   Streptococcus pyogenes NOT DETECTED NOT DETECTED Final   Acinetobacter baumannii NOT DETECTED NOT DETECTED Final   Enterobacteriaceae species NOT DETECTED NOT DETECTED Final   Enterobacter cloacae complex NOT DETECTED NOT DETECTED Final   Escherichia coli NOT DETECTED NOT DETECTED Final   Klebsiella oxytoca NOT DETECTED NOT DETECTED Final   Klebsiella pneumoniae NOT DETECTED NOT DETECTED Final   Proteus species NOT DETECTED NOT DETECTED Final   Serratia marcescens NOT DETECTED NOT DETECTED Final   Haemophilus influenzae NOT DETECTED NOT DETECTED Final   Neisseria meningitidis NOT DETECTED NOT DETECTED Final   Pseudomonas aeruginosa NOT DETECTED NOT DETECTED Final   Candida albicans NOT DETECTED NOT DETECTED Final   Candida glabrata NOT DETECTED NOT DETECTED Final   Candida krusei NOT DETECTED NOT DETECTED Final   Candida parapsilosis NOT DETECTED NOT DETECTED Final   Candida tropicalis NOT DETECTED NOT DETECTED Final    Comment: Performed at [facility], [address] [place], [place] [address]  Expectorated sputum assessment w rflx to resp cult     Status: None   Collection Time: 07/14/19 11:08 PM   Specimen: Sputum  Result Value Ref Range Status   Specimen Description SPUTUM  Final   Special Requests NONE  Final   Sputum evaluation   Final    THIS SPECIMEN IS ACCEPTABLE FOR SPUTUM CULTURE Performed at [facility], [address], [place], [place] [address]    Report Status 07/15/2019 FINAL  Final  Culture, respiratory     Status: None   Collection Time: 07/14/19 11:08 PM   Specimen: SPU  Result Value Ref Range Status   Specimen Description SPUTUM  Final   Special Requests NONE Reflexed from [id number]  Final   Gram Stain   Final    FEW WBC PRESENT,BOTH PMN AND MONONUCLEAR MODERATE GRAM POSITIVE RODS FEW GRAM NEGATIVE RODS RARE  GRAM POSITIVE COCCI IN PAIRS Performed at [facility], [address] [place], [place] [address]    Culture FEW KLEBSIELLA PNEUMONIAE  Final   Report Status 07/17/2019 FINAL  Final   Organism ID, Bacteria KLEBSIELLA PNEUMONIAE  Final      Susceptibility   Klebsiella pneumoniae - MIC*    AMPICILLIN >=32 RESISTANT Resistant     CEFAZOLIN <=4 SENSITIVE Sensitive     CEFEPIME <=1 SENSITIVE Sensitive     CEFTAZIDIME <=1 SENSITIVE Sensitive     CEFTRIAXONE <=1 SENSITIVE Sensitive     CIPROFLOXACIN <=0.25 SENSITIVE Sensitive     GENTAMICIN <=1 SENSITIVE Sensitive     IMIPENEM <=0.25 SENSITIVE Sensitive     TRIMETH/SULFA <=20 SENSITIVE Sensitive     AMPICILLIN/SULBACTAM 8 SENSITIVE Sensitive     PIP/TAZO <=4 SENSITIVE Sensitive     * FEW KLEBSIELLA PNEUMONIAE  Urine Culture     Status: Abnormal   Collection Time: 07/16/19 12:03 AM   Specimen: Urine, Random  Result Value Ref Range Status   Specimen Description URINE, RANDOM  Final   Special Requests   Final    NONE Performed at [facility], [address], [place], [place] [address]    Culture 80,000 COLONIES/mL CITROBACTER KOSERI (A)  Final   Report Status 07/17/2019 FINAL  Final   Organism ID, Bacteria CITROBACTER KOSERI (A)  Final      Susceptibility   Citrobacter koseri - MIC*    CEFAZOLIN <=4 SENSITIVE Sensitive  CEFTRIAXONE <=1 SENSITIVE Sensitive     CIPROFLOXACIN <=0.25 SENSITIVE Sensitive     GENTAMICIN <=1 SENSITIVE Sensitive     IMIPENEM <=0.25 SENSITIVE Sensitive     NITROFURANTOIN <=16 SENSITIVE Sensitive     TRIMETH/SULFA <=20 SENSITIVE Sensitive     PIP/TAZO <=4 SENSITIVE Sensitive     * 80,000 COLONIES/mL CITROBACTER KOSERI     Labs: BNP (last 3 results) No results for input(s): BNP in the last 8760 hours. Basic Metabolic Panel: Recent Labs  Lab 07/17/19 0244 07/18/19 0953 07/19/19 0521 07/20/19 0725 07/21/19 0533  NA 155* 150* 151* 146* 146*  K 3.5 3.5 3.8 4.0 3.9  CL 119* 118* 119*  118* 114*  CO2 24 23 23  21* 22  GLUCOSE 133* 124* 121* 105* 135*  BUN 40* 25* 24* 21* 25*  CREATININE 2.30* 1.54* 1.33* 1.00 1.10  CALCIUM 8.7* 8.7* 8.6* 8.5* 8.9   Liver Function Tests: No results for input(s): AST, ALT, ALKPHOS, BILITOT, PROT, ALBUMIN in the last 168 hours. No results for input(s): LIPASE, AMYLASE in the last 168 hours. No results for input(s): AMMONIA in the last 168 hours. CBC: Recent Labs  Lab 07/17/19 0244 07/18/19 0953 07/19/19 0521 07/20/19 0725 07/21/19 0533  WBC 5.8 3.6* 3.7* 4.5 5.2  HGB 13.7 14.0 13.3 14.2 13.7  HCT 42.4 42.7 40.8 42.9 41.3  MCV 63.4* 62.9* 62.9* 62.6* 61.4*  PLT 167 165 162 170 181   Cardiac Enzymes: No results for input(s): CKTOTAL, CKMB, CKMBINDEX, TROPONINI in the last 168 hours. BNP: Invalid input(s): POCBNP CBG: Recent Labs  Lab 07/20/19 0836 07/20/19 1132 07/20/19 1616 07/20/19 2028 07/21/19 0630  GLUCAP 120* 132* 114* 119* 116*   D-Dimer No results for input(s): DDIMER in the last 72 hours. Hgb A1c No results for input(s): HGBA1C in the last 72 hours. Lipid Profile No results for input(s): CHOL, HDL, LDLCALC, TRIG, CHOLHDL, LDLDIRECT in the last 72 hours. Thyroid function studies No results for input(s): TSH, T4TOTAL, T3FREE, THYROIDAB in the last 72 hours.  Invalid input(s): FREET3 Anemia work up No results for input(s): VITAMINB12, FOLATE, FERRITIN, TIBC, IRON, RETICCTPCT in the last 72 hours. Urinalysis    Component Value Date/Time   COLORURINE AMBER (A) 07/15/2019 0310   APPEARANCEUR CLEAR 07/15/2019 0310   LABSPEC 1.021 07/15/2019 0310   PHURINE 5.0 07/15/2019 0310   GLUCOSEU NEGATIVE 07/15/2019 0310   HGBUR SMALL (A) 07/15/2019 0310   BILIRUBINUR NEGATIVE 07/15/2019 0310   KETONESUR NEGATIVE 07/15/2019 0310   PROTEINUR 30 (A) 07/15/2019 0310   NITRITE NEGATIVE 07/15/2019 0310   LEUKOCYTESUR LARGE (A) 07/15/2019 0310   Sepsis Labs Invalid input(s): PROCALCITONIN,  WBC,   LACTICIDVEN Microbiology Recent Results (from the past 240 hour(s))  Culture, blood (routine x 2)     Status: None   Collection Time: 07/14/19  8:47 PM   Specimen: BLOOD  Result Value Ref Range Status   Specimen Description BLOOD LEFT ARM  Final   Special Requests   Final    BOTTLES DRAWN AEROBIC AND ANAEROBIC Blood Culture adequate volume   Culture   Final    NO GROWTH 5 DAYS Performed at [facility], [address] [place], [place] [address]    Report Status 07/19/2019 FINAL  Final  Culture, blood (routine x 2)     Status: Abnormal   Collection Time: 07/14/19  8:53 PM   Specimen: BLOOD  Result Value Ref Range Status   Specimen Description BLOOD RIGHT ARM  Final   Special  Requests   Final    BOTTLES DRAWN AEROBIC AND ANAEROBIC Blood Culture adequate volume   Culture  Setup Time   Final    IN BOTH AEROBIC AND ANAEROBIC BOTTLES GRAM POSITIVE COCCI IN CLUSTERS Organism ID to follow CRITICAL RESULT CALLED TO, READ BACK BY AND VERIFIED WITH: E MARTIN PHARMD 07/15/19 2105 JDW    Culture (A)  Final    STAPHYLOCOCCUS SPECIES (COAGULASE NEGATIVE) THE SIGNIFICANCE OF ISOLATING THIS ORGANISM FROM A SINGLE SET OF BLOOD CULTURES WHEN MULTIPLE SETS ARE DRAWN IS UNCERTAIN. PLEASE NOTIFY THE MICROBIOLOGY DEPARTMENT WITHIN ONE WEEK IF SPECIATION AND SENSITIVITIES ARE REQUIRED. Performed at [facility], [address] [place], [place] [address]    Report Status 07/17/2019 FINAL  Final  Blood Culture ID Panel (Reflexed)     Status: Abnormal   Collection Time: 07/14/19  8:53 PM  Result Value Ref Range Status   Enterococcus species NOT DETECTED NOT DETECTED Final   Listeria monocytogenes NOT DETECTED NOT DETECTED Final   Staphylococcus species DETECTED (A) NOT DETECTED Final    Comment: Methicillin (oxacillin) resistant coagulase negative staphylococcus. Possible blood culture contaminant (unless isolated from more than one blood culture draw or clinical case suggests  pathogenicity). No antibiotic treatment is indicated for blood  culture contaminants. CRITICAL RESULT CALLED TO, READ BACK BY AND VERIFIED WITH: E MARTIN PHARMD 07/15/19 2105 JDW    Staphylococcus aureus (BCID) NOT DETECTED NOT DETECTED Final   Methicillin resistance DETECTED (A) NOT DETECTED Final    Comment: CRITICAL RESULT CALLED TO, READ BACK BY AND VERIFIED WITH: E MARTIN PHARMD 07/15/19 2105 JDW    Streptococcus species NOT DETECTED NOT DETECTED Final   Streptococcus agalactiae NOT DETECTED NOT DETECTED Final   Streptococcus pneumoniae NOT DETECTED NOT DETECTED Final   Streptococcus pyogenes NOT DETECTED NOT DETECTED Final   Acinetobacter baumannii NOT DETECTED NOT DETECTED Final   Enterobacteriaceae species NOT DETECTED NOT DETECTED Final   Enterobacter cloacae complex NOT DETECTED NOT DETECTED Final   Escherichia coli NOT DETECTED NOT DETECTED Final   Klebsiella oxytoca NOT DETECTED NOT DETECTED Final   Klebsiella pneumoniae NOT DETECTED NOT DETECTED Final   Proteus species NOT DETECTED NOT DETECTED Final   Serratia marcescens NOT DETECTED NOT DETECTED Final   Haemophilus influenzae NOT DETECTED NOT DETECTED Final   Neisseria meningitidis NOT DETECTED NOT DETECTED Final   Pseudomonas aeruginosa NOT DETECTED NOT DETECTED Final   Candida albicans NOT DETECTED NOT DETECTED Final   Candida glabrata NOT DETECTED NOT DETECTED Final   Candida krusei NOT DETECTED NOT DETECTED Final   Candida parapsilosis NOT DETECTED NOT DETECTED Final   Candida tropicalis NOT DETECTED NOT DETECTED Final    Comment: Performed at [facility], [address] [place], [place] [address]  Expectorated sputum assessment w rflx to resp cult     Status: None   Collection Time: 07/14/19 11:08 PM   Specimen: Sputum  Result Value Ref Range Status   Specimen Description SPUTUM  Final   Special Requests NONE  Final   Sputum evaluation   Final    THIS SPECIMEN IS ACCEPTABLE FOR SPUTUM CULTURE Performed  at [facility], [address], [place], [place] [address]    Report Status 07/15/2019 FINAL  Final  Culture, respiratory     Status: None   Collection Time: 07/14/19 11:08 PM   Specimen: SPU  Result Value Ref Range Status   Specimen Description SPUTUM  Final   Special Requests NONE  Reflexed from [id number]  Final   Gram Stain   Final    FEW WBC PRESENT,BOTH PMN AND MONONUCLEAR MODERATE GRAM POSITIVE RODS FEW GRAM NEGATIVE RODS RARE GRAM POSITIVE COCCI IN PAIRS Performed at [facility], [address] [place], [place] [address]    Culture FEW KLEBSIELLA PNEUMONIAE  Final   Report Status 07/17/2019 FINAL  Final   Organism ID, Bacteria KLEBSIELLA PNEUMONIAE  Final      Susceptibility   Klebsiella pneumoniae - MIC*    AMPICILLIN >=32 RESISTANT Resistant     CEFAZOLIN <=4 SENSITIVE Sensitive     CEFEPIME <=1 SENSITIVE Sensitive     CEFTAZIDIME <=1 SENSITIVE Sensitive     CEFTRIAXONE <=1 SENSITIVE Sensitive     CIPROFLOXACIN <=0.25 SENSITIVE Sensitive     GENTAMICIN <=1 SENSITIVE Sensitive     IMIPENEM <=0.25 SENSITIVE Sensitive     TRIMETH/SULFA <=20 SENSITIVE Sensitive     AMPICILLIN/SULBACTAM 8 SENSITIVE Sensitive     PIP/TAZO <=4 SENSITIVE Sensitive     * FEW KLEBSIELLA PNEUMONIAE  Urine Culture     Status: Abnormal   Collection Time: 07/16/19 12:03 AM   Specimen: Urine, Random  Result Value Ref Range Status   Specimen Description URINE, RANDOM  Final   Special Requests   Final    NONE Performed at [facility], [address], [place], [place] [address]    Culture 80,000 COLONIES/mL CITROBACTER KOSERI (A)  Final   Report Status 07/17/2019 FINAL  Final   Organism ID, Bacteria CITROBACTER KOSERI (A)  Final      Susceptibility   Citrobacter koseri - MIC*    CEFAZOLIN <=4 SENSITIVE Sensitive     CEFTRIAXONE <=1 SENSITIVE Sensitive     CIPROFLOXACIN <=0.25 SENSITIVE Sensitive     GENTAMICIN <=1 SENSITIVE Sensitive     IMIPENEM <=0.25 SENSITIVE Sensitive      NITROFURANTOIN <=16 SENSITIVE Sensitive     TRIMETH/SULFA <=20 SENSITIVE Sensitive     PIP/TAZO <=4 SENSITIVE Sensitive     * 80,000 COLONIES/mL CITROBACTER KOSERI     Time coordinating discharge: 40 minutes  SIGNED:   Dorcas Carrow, MD  [facility] 07/21/2019, 10:36 AM

## 2019-07-21 NOTE — Progress Notes (Signed)
Pt discharged to [facility]. Pt. Had no c/o pain, skin was intact, and was A&O X 4 at the time of discharge. IV to left upper extremity was removed without difficulty.  All belongings were sent with the pt via [facility].

## 2019-07-21 NOTE — TOC Transition Note (Signed)
Transition of Care [facility]) - CM/SW Discharge Note   Patient Details  Name: Jonathan Flowers MRN: [id number] Date of Birth: 09/08/1960  Transition of Care [facility]) CM/SW Contact:  Kermit Balo, RN Phone Number: 07/21/2019, 11:22 AM   Clinical Narrative:    Pt discharging to [facility] today in [place], [place]. Pts brother is aware and is going to call [facility] and pay for the transportation. CM has arranged transport via [facility] for 2 pm. Bedside RN, family and facility aware of the plan. D/c packet at the desk.   Room: 405 Number for report: [phone]   Final next level of care: Skilled Nursing Facility Barriers to Discharge: No Barriers Identified   Patient Goals and CMS Choice   CMS Medicare.gov Compare Post Acute Care list provided to:: Patient Represenative (must comment) Choice offered to / list presented to : Sibling  Discharge Placement              Patient chooses bed at:  ([facility]) Patient to be transferred to facility by: [facility] Name of family member notified: Jamse Belfast Patient and family notified of of transfer: 07/21/19  Discharge Plan and Services In-house Referral: Clinical Social Work Discharge Planning Services: [employer] Consult Post Acute Care Choice: Skilled Nursing Facility                               Social Determinants of Health (SDOH) Interventions     Readmission Risk Interventions No flowsheet data found.

## 2019-08-25 ENCOUNTER — Ambulatory Visit (INDEPENDENT_AMBULATORY_CARE_PROVIDER_SITE_OTHER): Payer: 59 | Admitting: Adult Health

## 2019-08-25 ENCOUNTER — Encounter: Payer: Self-pay | Admitting: Adult Health

## 2019-08-25 ENCOUNTER — Other Ambulatory Visit: Payer: Self-pay

## 2019-08-25 VITALS — BP 127/89 | HR 95

## 2019-08-25 DIAGNOSIS — I10 Essential (primary) hypertension: Secondary | ICD-10-CM

## 2019-08-25 DIAGNOSIS — I619 Nontraumatic intracerebral hemorrhage, unspecified: Secondary | ICD-10-CM | POA: Diagnosis not present

## 2019-08-25 DIAGNOSIS — E785 Hyperlipidemia, unspecified: Secondary | ICD-10-CM | POA: Diagnosis not present

## 2019-08-25 DIAGNOSIS — G8194 Hemiplegia, unspecified affecting left nondominant side: Secondary | ICD-10-CM | POA: Diagnosis not present

## 2019-08-25 DIAGNOSIS — H53462 Homonymous bilateral field defects, left side: Secondary | ICD-10-CM

## 2019-08-25 NOTE — Progress Notes (Signed)
I agree with the above plan 

## 2019-08-25 NOTE — Patient Instructions (Addendum)
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  Residual deficits include left hemiplegia (flaccid) neglect, lack of sensation, visual impairments and swallowing difficulties.  Your Plan:  Repeat MRI brain for follow-up imaging regarding recent hemorrhage and to evaluate for possible underlying causes of bleed -facility will be called to schedule imaging  Continue working closely with physical, occupational and speech therapy  Ensure continued monitoring of blood pressure at facility to ensure adequate management  Continue atorvastatin for cholesterol management    Follow-up in 3 months or call earlier if needed       Thank you for coming to see [place] at [facility]. I hope we have been able to provide you high quality care today.  You may receive a patient satisfaction survey over the next few weeks. We would appreciate your feedback and comments so that we may continue to improve ourselves and the health of our patients.

## 2019-08-25 NOTE — Progress Notes (Signed)
[facility] [address] [place]. Jonathan Flowers [address] [phone]       HOSPITAL FOLLOW UP NOTE  [name] Date of Birth:  [birth year] Medical Record Number:  [id number]   Reason for Referral:  hospital stroke follow up    SUBJECTIVE:   CHIEF COMPLAINT:  Chief Complaint  Patient presents with  . Follow-up    rm 5 here for a stroke f/u. Pt is here with his family .    HPI:   [name] is a 59 y.o. male with history of HTN  who presented on 07/10/2019 with R gaze deviation and L sided flacidity. In ED developed less responsive state, HTN emergency and urine incontinence.  Stroke work-up revealed right BG ICH with cerebral edema and midline shift and left MCA periventricular infarct likely secondary to small vessel disease source.  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  Neurosurgery consulted who did not feel intervention warranted.  Hypertensive emergency with BP 206/109 treated with Cleviprex and discharged on amlodipine and losartan.  LDL 117 initiate atorvastatin at discharge.  Other stroke risk factors include UTI, AKI and prior ICH on imaging.  Residual deficits of speech impairment, left hemianopia, right gaze preference, dysphagia and left hemiparesis.  Started on amantadine for lethargy with improvement.  Evaluated by therapies and recommended discharge to SNF for ongoing therapy needs.  ICH:  R BG ICH with cerebral edema  Stroke: L MCA periventricular infarct, likely secondary to small vessel disease source  CT head x 2 R basal ganglia ICH w/ mass effect and 82mm R midline shift.   CTA head no LVO. Distal L A2 moderate/severe stenosis. Distal R A2 moderate stenosis.   MRI 6/7 Stable R basal ganglia ICH. L periatrial white matter infarct. Multiple scattered microhemorrhages w/ old L temporal lobe ICH. Small vessel disease. Atrophy.   CT head 6/10 unchanged R basal ganglia hemorrhage, increased edema. Mass  effect and 23mm midline shift same. Small L parietal white matter infarct.    CT head 6/13 - stable hematoma and MLS at 19mm  2D Echo EF 55-60%. No source of embolus    EEG - 07/15/19 - moderate to severe diffuse encephalopathy, no seizures   LDL 117 -initiate atorvastatin  HgbA1c 5.1  SCDs for VTE prophylaxis  No antithrombotic prior to admission, now on No antithrombotic given hemorrhage   Therapy recommendations:  CIR (not at CIR level) ->SNF   Disposition:  SNF   Today, 08/25/2019, Jonathan Flowers is being seen for hospital follow-up accompanied by his son and wife.  Currently residing [facility].  Residual deficits of dysphagia, left hemianopia, right gaze preference, left hemiparesis with diminished sensation and left-sided neglect.  Denies new stroke/TIA symptoms.  Currently working with PT/OT/ST.  Per review of MAR faxed by facility, continues on atorvastatin for HLD management and denies side effects. Blood pressure today 127/89.  Continues on hydralazine as needed, losartan 50 mg and amlodipine 10 mg.  Remains on amantadine which was initiated during hospitalization.  No further concerns at this time.    ROS:   14 system review of systems performed and negative with exception of see HPI  PMH:  Past Medical History:  Diagnosis Date  . HTN (hypertension)     PSH: No past surgical history on file.  Social History:  Social History   Socioeconomic History  . Marital status: Married    Spouse name: Not on file  . Number of  children: Not on file  . Years of education: Not on file  . Highest education level: Not on file  Occupational History  . Not on file  Tobacco Use  . Smoking status: Never Smoker  . Smokeless tobacco: Never Used  Substance and Sexual Activity  . Alcohol use: Never  . Drug use: Never  . Sexual activity: Not Currently    Birth control/protection: None  Other Topics Concern  . Not on file  Social History Narrative  . Not on file   Social  Determinants of Health   Financial Resource Strain:   . Difficulty of Paying Living Expenses:   Food Insecurity:   . Worried About Programme researcher, broadcasting/film/videounning Out of Food in the Last Year:   . Baristaan Out of Food in the Last Year:   Transportation Needs:   . Freight forwarderLack of Transportation (Medical):   Marland Kitchen. Lack of Transportation (Non-Medical):   Physical Activity:   . Days of Exercise per Week:   . Minutes of Exercise per Session:   Stress:   . Feeling of Stress :   Social Connections:   . Frequency of Communication with Friends and Family:   . Frequency of Social Gatherings with Friends and Family:   . Attends Religious Services:   . Active Member of Clubs or Organizations:   . Attends BankerClub or Organization Meetings:   Marland Kitchen. Marital Status:   Intimate Partner Violence:   . Fear of Current or Ex-Partner:   . Emotionally Abused:   Marland Kitchen. Physically Abused:   . Sexually Abused:     Family History: No family history on file.  Medications:   Current Outpatient Medications on File Prior to Visit  Medication Sig Dispense Refill  . amantadine (SYMMETREL) 100 MG capsule Take 1 capsule (100 mg total) by mouth 2 (two) times daily.    Marland Kitchen. amLODipine (NORVASC) 10 MG tablet Take 1 tablet (10 mg total) by mouth daily.    Marland Kitchen. atorvastatin (LIPITOR) 40 MG tablet Take 1 tablet (40 mg total) by mouth daily.    . feeding supplement, ENSURE ENLIVE, (ENSURE ENLIVE) LIQD Take 237 mLs by mouth 3 (three) times daily between meals. 237 mL 12  . FLUoxetine (PROZAC) 10 MG capsule Take 10 mg by mouth daily.    . hydrALAZINE (APRESOLINE) 25 MG tablet Take 1 tablet (25 mg total) by mouth 4 (four) times daily as needed (for SBP >160 and DBP >100). 120 tablet 11  . lidocaine (LMX) 4 % cream Apply 1 application topically as needed.    Marland Kitchen. losartan (COZAAR) 50 MG tablet Take 1 tablet (50 mg total) by mouth 2 (two) times daily.    . Multiple Vitamin (MULTIVITAMIN WITH MINERALS) TABS tablet Take 1 tablet by mouth daily.    . pantoprazole (PROTONIX) 40 MG  tablet Take 1 tablet (40 mg total) by mouth daily.    . polyethylene glycol (MIRALAX / GLYCOLAX) 17 g packet Take 17 g by mouth daily. 14 each 0  . senna-docusate (SENOKOT-S) 8.6-50 MG tablet Take 1 tablet by mouth 2 (two) times daily.     No current facility-administered medications on file prior to visit.    Allergies:  No Known Allergies    OBJECTIVE:  Physical Exam  Vitals:   08/25/19 1517 08/25/19 1519  BP: (!) 137/97 (!) 127/89  Pulse: 102 95   There is no height or weight on file to calculate BMI. No exam data present  No flowsheet data found.   General: well developed, well nourished,  pleasant middle-aged male, seated, in no evident distress Head: head normocephalic and atraumatic.   Neck: supple with no carotid or supraclavicular bruits Cardiovascular: regular rate and rhythm, no murmurs Musculoskeletal: no deformity Skin:  no rash/petichiae Vascular:  Normal pulses all extremities   Neurologic Exam Mental Status: Awake and fully alert.   Follows all simple commands.  Hesitancy of speech with likely dysarthria but difficulty fully assessing aphasia due to underlying language barrier.  Oriented to place and time. Recent and remote memory intact. Attention span, concentration and fund of knowledge appeared appropriate during visit with family providing some history. Mood and affect appropriate.  Cranial Nerves: Fundoscopic exam reveals sharp disc margins. Pupils equal, briskly reactive to light. Extraocular movements right gaze preference but able to cross midline with difficulty. Visual fields left homonymous hemianopia as no blink to threat on left. Hearing intact. Facial sensation intact.  Left lower facial paralysis. Motor: Left hemiplegia without movement even to painful stimuli.  Full strength right upper and lower extremity Sensory.:  No response to painful stimuli.  Patient did report being able to feel painful pressure but unable to appropriately state what body  part pressure was on Coordination: Rapid alternating movements normal on right side. Finger-to-nose and heel-to-shin performed accurately on right side Gait and Station: Deferred as patient nonambulatory Reflexes: 1+ and symmetric. Toes downgoing.      NIHSS 18 1a.  Level of consciousness 0 1b. LOC questions 0 1c. LOC commands 0 2.  Best gaze 1 3.  Visual 2 4.  Facial palsy 2 5a.  Motor arm-left 4 5b.  Motor arm-right 0 6a.  Motor leg-left 4 6b.  Motor leg-right 0 7.  Limb ataxia 0 8.  Sensory 2 9.  Best language 0 10.  Dysarthria 1 11.  Extinction and inattention 2  Modified Rankin  4-5     ASSESSMENT: Dante Spangle is a 59 y.o. year old male presented with a gaze deviation and left-sided flaccidity on 07/10/2019 developing left responsive state, HTN emergency and urinary in ED with stroke work-up revealing right BG ICH with cerebral edema and midline shift and left MCA periventricular infarct likely secondary to small vessel disease source. Vascular risk factors include HTN, HLD, left temporal lobe ICH on imaging, and multiple microhemorrhages.  Currently residing at SNF with significant residual deficits     PLAN:  1. Right BG ICH:  -Repeat MRI wo contrast to eval prior ICH and to assess for possible underlying etiologies.  Unable to perform imaging with contrast due to kidney injury -Residual deficit: Left hemiplegia with diminished sensation and neglect, right gaze preference, left hemianopia, dysarthria, dysphagia and left lower facial paralysis.  Continue to work with PT/OT/ST.  Discussed with family due to extent of deficits, difficulty to determine how much recovery he will gain -Continue atorvastatin for secondary stroke prevention. -Antithrombotic currently contraindicated due to recent ICH -Close PCP follow up for aggressive stroke risk factor management  2. Left MCA infarct: See #1 3. HTN: BP goal <130/90.  Stable today.  Continue f/u with PCP/facility 4. HLD: LDL  goal <70. Recent LDL 117.  Continue atorvastatin and f/u with PCP/facility for management as well as prescribing of statin     Follow up in 3 months or call earlier if needed   I spent 45 minutes of face-to-face and non-face-to-face time with patient, wife and son.  This included previsit chart review, lab review, study review, order entry, electronic health record documentation, patient education regarding recent stroke, residual deficits,  importance of managing stroke risk factors and answered all questions to patient and family's satisfaction     Ihor Austin, [facility]  [facility] [address] [place], [place] [address]  Phone [phone] Fax [phone] Note: This document was prepared with digital dictation and possible smart phrase technology. Any transcriptional errors that result from this process are unintentional.

## 2019-09-01 ENCOUNTER — Telehealth: Payer: Self-pay | Admitting: Adult Health

## 2019-09-01 NOTE — Telephone Encounter (Signed)
[facility] notes.

## 2019-09-12 NOTE — Telephone Encounter (Signed)
Maureen Ralphs from his assistance living called last week wanting the patient to go to [facility].. I tried to change the sight location to [place] [facility] but they were not in network with the plan for a MRI. I called Maureen Ralphs back and left her a voicemail to inform her of this and that the patient would have to go to [facility]. Maureen Ralphs phone number is [phone].   [facility]kley Harvey: [phone] (exp. 09/01/19 to 11/30/19)

## 2019-09-23 ENCOUNTER — Other Ambulatory Visit: Payer: 59

## 2019-10-04 ENCOUNTER — Other Ambulatory Visit: Payer: 59

## 2019-10-25 ENCOUNTER — Other Ambulatory Visit: Payer: 59

## 2019-10-25 ENCOUNTER — Telehealth: Payer: Self-pay | Admitting: Adult Health

## 2019-10-25 NOTE — Telephone Encounter (Signed)
Maureen Ralphs @ [facility] [place] has called to inform that re: the MRI that pt was scheduled for today, he will not make it because the required family member that needed to be there with him is not available.  If there are questions for Maureen Ralphs she can be reached at [phone]

## 2019-10-25 NOTE — Telephone Encounter (Signed)
Noted.  Sent message to [facility] with MRI as FYI.

## 2019-10-25 NOTE — Telephone Encounter (Signed)
Noted, thank you

## 2019-11-14 NOTE — Telephone Encounter (Signed)
Maureen Ralphs @ [facility] is asking for a call to know if Pt needs to keep upcoming appointment due to the MRI not being done yet, please call Maureen Ralphs

## 2019-11-15 NOTE — Telephone Encounter (Signed)
Called and spoke to vivian about pt MRI and appt.  I relayed that better to see him back after MRI done on 11-29-19.  Made appt 12-07-19 Wednesday at 1515 arrive 1445.  She will relay.  Pt had arrived late for one MRI, then could not make the reschedule (could not make appt).  Hopefully will make this one and then see afterwards for results.  She will let family know.

## 2019-11-24 ENCOUNTER — Ambulatory Visit: Payer: 59 | Admitting: Adult Health

## 2019-11-29 ENCOUNTER — Other Ambulatory Visit: Payer: Self-pay

## 2019-11-29 ENCOUNTER — Ambulatory Visit
Admission: RE | Admit: 2019-11-29 | Discharge: 2019-11-29 | Disposition: A | Discharge status: Home / Self Care | Payer: 59 | Source: Ambulatory Visit | Attending: Adult Health | Admitting: Adult Health

## 2019-11-29 DIAGNOSIS — I619 Nontraumatic intracerebral hemorrhage, unspecified: Secondary | ICD-10-CM

## 2019-12-07 ENCOUNTER — Ambulatory Visit: Payer: Self-pay | Admitting: Adult Health

## 2019-12-07 ENCOUNTER — Encounter: Payer: Self-pay | Admitting: Adult Health

## 2019-12-07 NOTE — Progress Notes (Deleted)
[facility] [address] [place]. [place] [address] [phone]       HOSPITAL FOLLOW UP NOTE  [name] Date of Birth:  1960-09-16 Medical Record Number:  [id number]   Reason for Referral:  hospital stroke follow up    SUBJECTIVE:   CHIEF COMPLAINT:  No chief complaint on file.   HPI:   Today, 12/07/2019, [name] returns for stroke follow-up after prior visit 4 months ago.  Presented to [facility] ED on 09/04/2019 from SNF with concern of him " looking different than normal".  Concern for stroke vs seizure.  MRI showed acute to early subacute left centrum semiovale infarct as well as subacute hemorrhage R BG with mild surrounding vasogenic edema, laminar necrosis, scattered punctate microhemorrhages throughout the bilateral cerebrum and pons, remote hemorrhage superior temporal gyrus and scattered lacunar infarcts in left BG and left CR.  Admitted for acute encephalopathy potentially secondary to baclofen use, acute/subacute infarct, questionable seizure activity and COVID-19 positive.  EEG showed moderate to severe slowing and isolated left parietal sharp wave and initiated Keppra.  Evidence of dysphagia initially requiring NG tube and eventually cleared for Dys1 NTL diet.  He was discharged back to [facility].   He has been stable since that time without new stroke/TIA symptoms.  Residual deficits of dysphagia, left hemianopia, right gaze preference and left hemiparesis has been stable without worsening.  Continues to work with therapies at [facility].      History provided for reference purposes only Initial visit 08/25/2019 JM: [name] is being seen for hospital follow-up accompanied by his son and wife.  Currently residing at [facility].  Residual deficits of dysphagia, left hemianopia, right gaze preference, left hemiparesis with diminished sensation and left-sided neglect.  Denies new stroke/TIA symptoms.  Currently working with PT/OT/ST.  Per  review of MAR faxed by facility, continues on atorvastatin for HLD management and denies side effects. Blood pressure today 127/89.  Continues on hydralazine as needed, losartan 50 mg and amlodipine 10 mg.  Remains on amantadine which was initiated during hospitalization.  No further concerns at this time.  Stroke admission 07/10/2019 Jonathan Flowers is a 59 y.o. male with history of HTN  who presented on 07/10/2019 with R gaze deviation and L sided flacidity. In ED developed less responsive state, HTN emergency and urine incontinence.  Stroke work-up revealed right BG ICH with cerebral edema and midline shift and left MCA periventricular infarct likely secondary to small vessel disease source.  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  Neurosurgery consulted who did not feel intervention warranted.  Hypertensive emergency with BP 206/109 treated with Cleviprex and discharged on amlodipine and losartan.  LDL 117 initiate atorvastatin at discharge.  Other stroke risk factors include UTI, AKI and prior ICH on imaging.  Residual deficits of speech impairment, left hemianopia, right gaze preference, dysphagia and left hemiparesis.  Started on amantadine for lethargy with improvement.  Evaluated by therapies and recommended discharge to SNF for ongoing therapy needs.  ICH:  R BG ICH with cerebral edema  Stroke: L MCA periventricular infarct, likely secondary to small vessel disease source  CT head x 2 R basal ganglia ICH w/ mass effect and 70mm R midline shift.   CTA head no LVO. Distal L A2 moderate/severe stenosis. Distal R A2 moderate stenosis.   MRI 6/7 Stable R basal ganglia ICH. L periatrial white matter infarct. Multiple scattered microhemorrhages w/ old L temporal lobe  ICH. Small vessel disease. Atrophy.   CT head 6/10 unchanged R basal ganglia hemorrhage, increased edema. Mass effect and 44mm midline shift same. Small L parietal white matter infarct.    CT head  6/13 - stable hematoma and MLS at 86mm  2D Echo EF 55-60%. No source of embolus    EEG - 07/15/19 - moderate to severe diffuse encephalopathy, no seizures   LDL 117 -initiate atorvastatin  HgbA1c 5.1  SCDs for VTE prophylaxis  No antithrombotic prior to admission, now on No antithrombotic given hemorrhage   Therapy recommendations:  CIR (not at CIR level) ->SNF   Disposition:  SNF      ROS:   14 system review of systems performed and negative with exception of see HPI  PMH:  Past Medical History:  Diagnosis Date  . HTN (hypertension)     PSH: No past surgical history on file.  Social History:  Social History   Socioeconomic History  . Marital status: Married    Spouse name: Not on file  . Number of children: Not on file  . Years of education: Not on file  . Highest education level: Not on file  Occupational History  . Not on file  Tobacco Use  . Smoking status: Never Smoker  . Smokeless tobacco: Never Used  Substance and Sexual Activity  . Alcohol use: Never  . Drug use: Never  . Sexual activity: Not Currently    Birth control/protection: None  Other Topics Concern  . Not on file  Social History Narrative  . Not on file   Social Determinants of Health   Financial Resource Strain:   . Difficulty of Paying Living Expenses: Not on file  Food Insecurity:   . Worried About Programme researcher, broadcasting/film/video in the Last Year: Not on file  . Ran Out of Food in the Last Year: Not on file  Transportation Needs:   . Lack of Transportation (Medical): Not on file  . Lack of Transportation (Non-Medical): Not on file  Physical Activity:   . Days of Exercise per Week: Not on file  . Minutes of Exercise per Session: Not on file  Stress:   . Feeling of Stress : Not on file  Social Connections:   . Frequency of Communication with Friends and Family: Not on file  . Frequency of Social Gatherings with Friends and Family: Not on file  . Attends Religious Services: Not on file  .  Active Member of Clubs or Organizations: Not on file  . Attends Banker Meetings: Not on file  . Marital Status: Not on file  Intimate Partner Violence:   . Fear of Current or Ex-Partner: Not on file  . Emotionally Abused: Not on file  . Physically Abused: Not on file  . Sexually Abused: Not on file    Family History: No family history on file.  Medications:   Current Outpatient Medications on File Prior to Visit  Medication Sig Dispense Refill  . amantadine (SYMMETREL) 100 MG capsule Take 1 capsule (100 mg total) by mouth 2 (two) times daily.    Marland Kitchen amLODipine (NORVASC) 10 MG tablet Take 1 tablet (10 mg total) by mouth daily.    Marland Kitchen atorvastatin (LIPITOR) 40 MG tablet Take 1 tablet (40 mg total) by mouth daily.    . feeding supplement, ENSURE ENLIVE, (ENSURE ENLIVE) LIQD Take 237 mLs by mouth 3 (three) times daily between meals. 237 mL 12  . FLUoxetine (PROZAC) 10 MG capsule Take 10 mg  by mouth daily.    . hydrALAZINE (APRESOLINE) 25 MG tablet Take 1 tablet (25 mg total) by mouth 4 (four) times daily as needed (for SBP >160 and DBP >100). 120 tablet 11  . lidocaine (LMX) 4 % cream Apply 1 application topically as needed.    Marland Kitchen losartan (COZAAR) 50 MG tablet Take 1 tablet (50 mg total) by mouth 2 (two) times daily.    . Multiple Vitamin (MULTIVITAMIN WITH MINERALS) TABS tablet Take 1 tablet by mouth daily.    . pantoprazole (PROTONIX) 40 MG tablet Take 1 tablet (40 mg total) by mouth daily.    . polyethylene glycol (MIRALAX / GLYCOLAX) 17 g packet Take 17 g by mouth daily. 14 each 0  . senna-docusate (SENOKOT-S) 8.6-50 MG tablet Take 1 tablet by mouth 2 (two) times daily.     No current facility-administered medications on file prior to visit.    Allergies:  No Known Allergies    OBJECTIVE:  Physical Exam  There were no vitals filed for this visit. There is no height or weight on file to calculate BMI. No exam data present  No flowsheet data found.   General: well  developed, well nourished,  pleasant middle-aged male, seated, in no evident distress Head: head normocephalic and atraumatic.   Neck: supple with no carotid or supraclavicular bruits Cardiovascular: regular rate and rhythm, no murmurs Musculoskeletal: no deformity Skin:  no rash/petichiae Vascular:  Normal pulses all extremities   Neurologic Exam Mental Status: Awake and fully alert.   Follows all simple commands.  Hesitancy of speech with likely dysarthria but difficulty fully assessing aphasia due to underlying language barrier.  Oriented to place and time. Recent and remote memory intact. Attention span, concentration and fund of knowledge appeared appropriate during visit with family providing some history. Mood and affect appropriate.  Cranial Nerves: Fundoscopic exam reveals sharp disc margins. Pupils equal, briskly reactive to light. Extraocular movements right gaze preference but able to cross midline with difficulty. Visual fields left homonymous hemianopia as no blink to threat on left. Hearing intact. Facial sensation intact.  Left lower facial paralysis. Motor: Left hemiplegia without movement even to painful stimuli.  Full strength right upper and lower extremity Sensory.:  No response to painful stimuli.  Patient did report being able to feel painful pressure but unable to appropriately state what body part pressure was on Coordination: Rapid alternating movements normal on right side. Finger-to-nose and heel-to-shin performed accurately on right side Gait and Station: Deferred as patient nonambulatory Reflexes: 1+ and symmetric. Toes downgoing.      NIHSS 18 1a.  Level of consciousness 0 1b. LOC questions 0 1c. LOC commands 0 2.  Best gaze 1 3.  Visual 2 4.  Facial palsy 2 5a.  Motor arm-left 4 5b.  Motor arm-right 0 6a.  Motor leg-left 4 6b.  Motor leg-right 0 7.  Limb ataxia 0 8.  Sensory 2 9.  Best language 0 10.  Dysarthria 1 11.  Extinction and inattention  2  Modified Rankin  4-5     ASSESSMENT/PLAN: Jonathan Flowers is a 59 y.o. year old male presented with a gaze deviation and left-sided flaccidity on 07/10/2019 developing left responsive state, HTN emergency and urinary in ED with stroke work-up revealing right BG ICH with cerebral edema and midline shift and left MCA periventricular infarct likely secondary to small vessel disease source.  Admission to [facility] 09/04/2019 with acute/subacute left centrum semiovale infarct and possible seizure activity.  Vascular risk  factors include HTN, HLD, left temporal lobe ICH on imaging, and multiple microhemorrhages.  Currently residing at SNF with significant residual deficits       1. Hx of ischemic and hemorrhagic strokes:  -Repeat MRI wo contrast to eval prior ICH and to assess for possible underlying etiologies.  Unable to perform imaging with contrast due to kidney injury -Residual deficit: Left hemiplegia with diminished sensation and neglect, right gaze preference, left hemianopia, dysarthria, dysphagia and left lower facial paralysis.  Continue to work with PT/OT/ST.  Discussed with family due to extent of deficits, difficulty to determine how much recovery he will gain -Continue atorvastatin for secondary stroke prevention. -Antithrombotic currently contraindicated due to recent ICH -Close PCP follow up for aggressive stroke risk factor management  2. Seizure, late effect of stroke: a. EEG 82/2021 left frontal sharp wave potentially epileptogenic b. Continue Keppra 3. HTN: BP goal <130/90.  Stable today.  Continue f/u with PCP/facility 4. HLD: LDL goal <70. Recent LDL 117.  Continue atorvastatin and f/u with PCP/facility for management as well as prescribing of statin     Follow up in 3 months or call earlier if needed   I spent 45 minutes of face-to-face and non-face-to-face time with patient, wife and son.  This included previsit chart review, lab review, study review, order entry,  electronic health record documentation, patient education regarding recent stroke, residual deficits, importance of managing stroke risk factors and answered all questions to patient and family's satisfaction     Ihor AustinJessica McCue, [facility] AdaGNP-BC  [facility] [address] [place], [place] [id number]  Phone [phone] Fax [phone] Note: This document was prepared with digital dictation and possible smart phrase technology. Any transcriptional errors that result from this process are unintentional.

## 2019-12-14 ENCOUNTER — Ambulatory Visit: Payer: Self-pay | Admitting: Adult Health

## 2020-02-16 ENCOUNTER — Ambulatory Visit: Payer: Self-pay | Admitting: Adult Health

## 2021-03-07 IMAGING — CT CT HEAD W/O CM
3 of 4 series · 13 of 47 positions shown, 15 images · non-contrast
Comparison: Brain MRI 07/11/2019, CT angiogram head/neck 07/10/2019

CLINICAL DATA: Stroke, follow-up.

EXAM:
CT HEAD WITHOUT CONTRAST
TECHNIQUE: Contiguous axial images were obtained from the base of the skull
through the vertex without intravenous contrast.

[Series 4: head wo · axial · 0.45mm/px · z∈[-126,-6]mm · 7 of 33 slices shown, 9 images]
[im 5/33  brain]
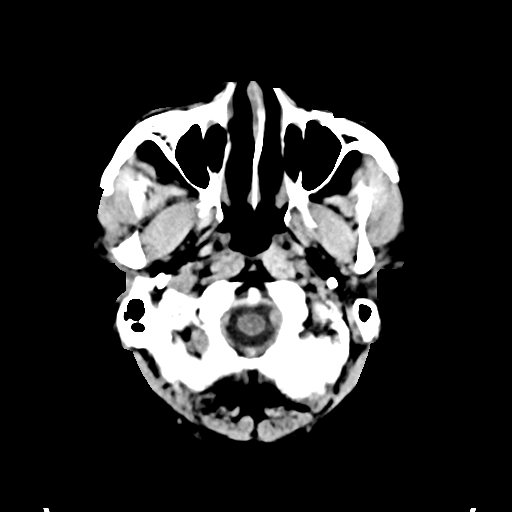
[im 5/33  bone]
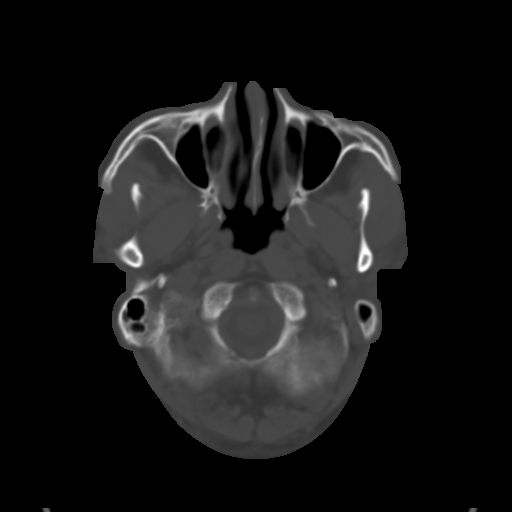
[im 9/33  brain]
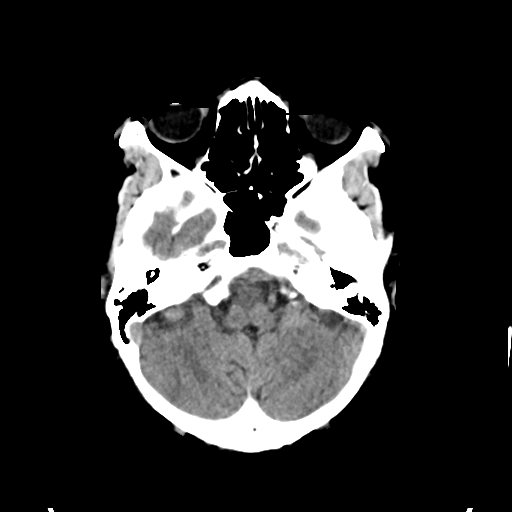
[im 13/33  brain]
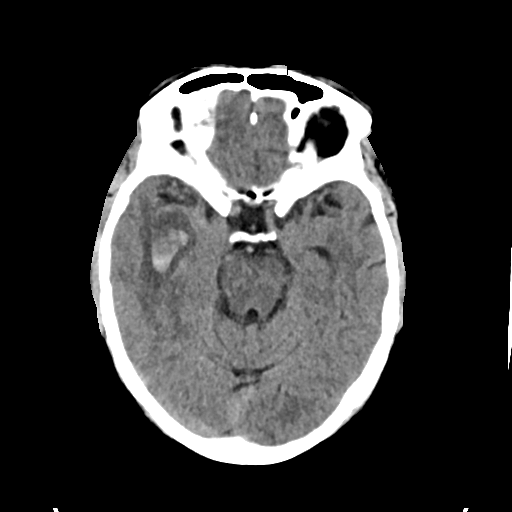
[im 17/33  brain]
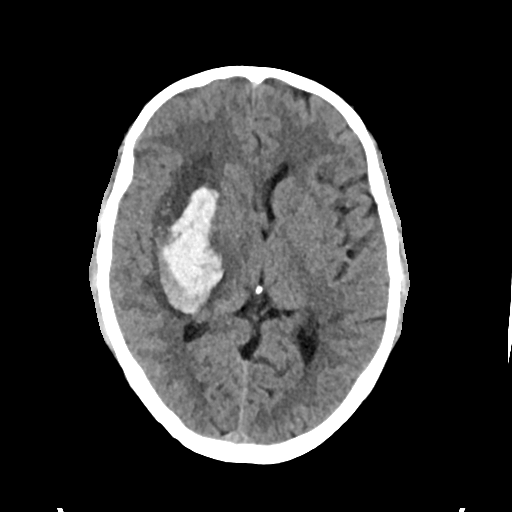
[im 21/33  brain]
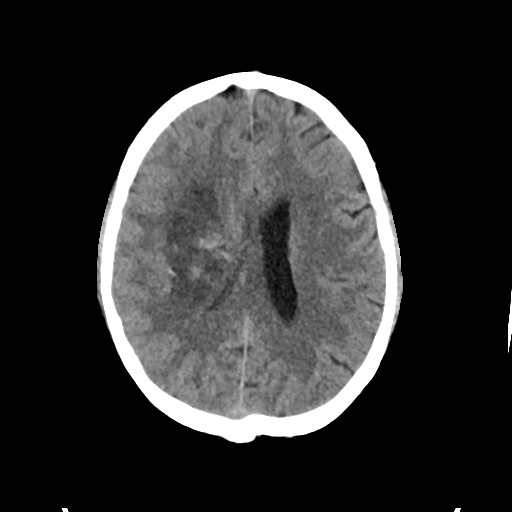
[im 21/33  bone]
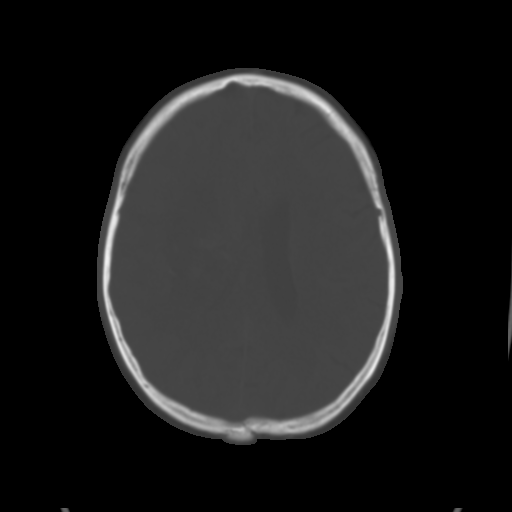
[im 25/33  brain]
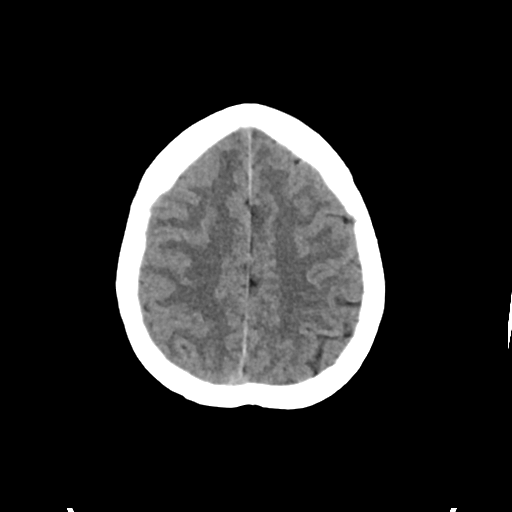
[im 29/33  brain]
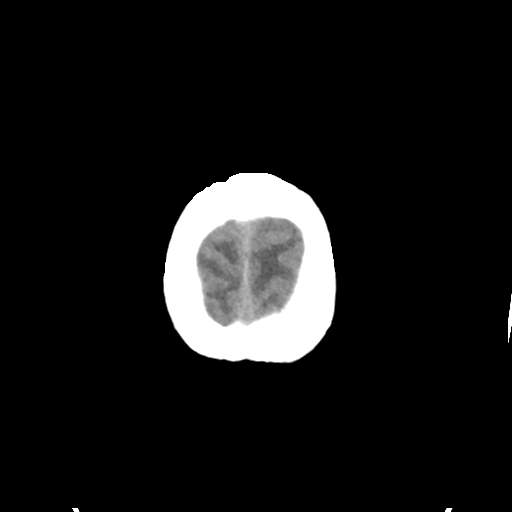

[Series 5: cor soft · coronal · 0.30mm/px · 3 of 70 slices shown]
[im 24/70  brain]
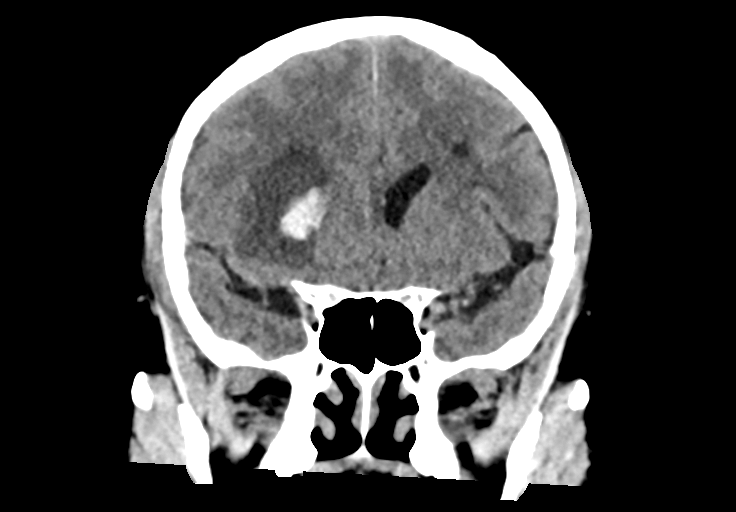
[im 31/70  brain]
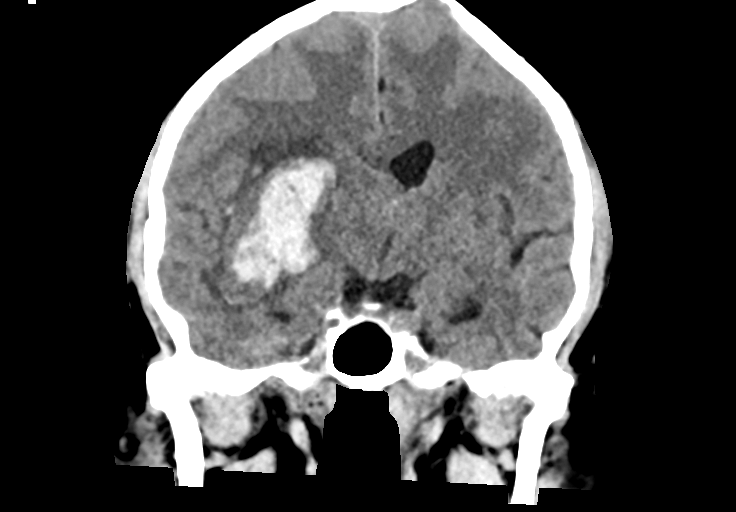
[im 39/70  brain]
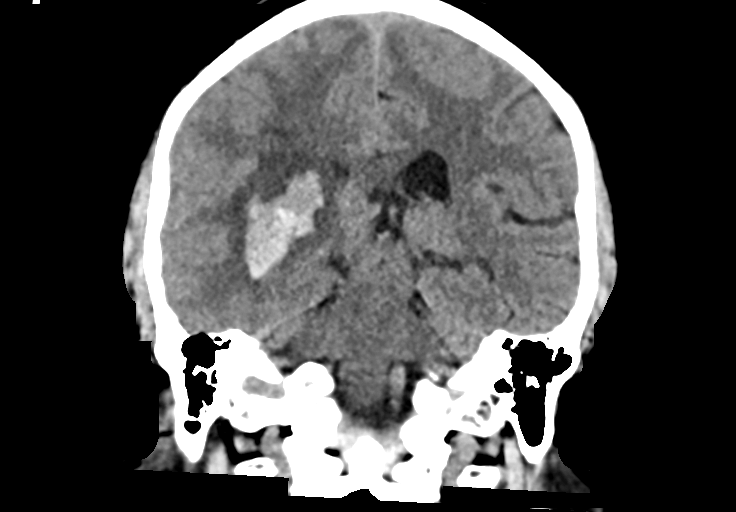

[Series 6: sag soft · sagittal · 0.30mm/px · 3 of 59 slices shown]
[im 20/59  brain]
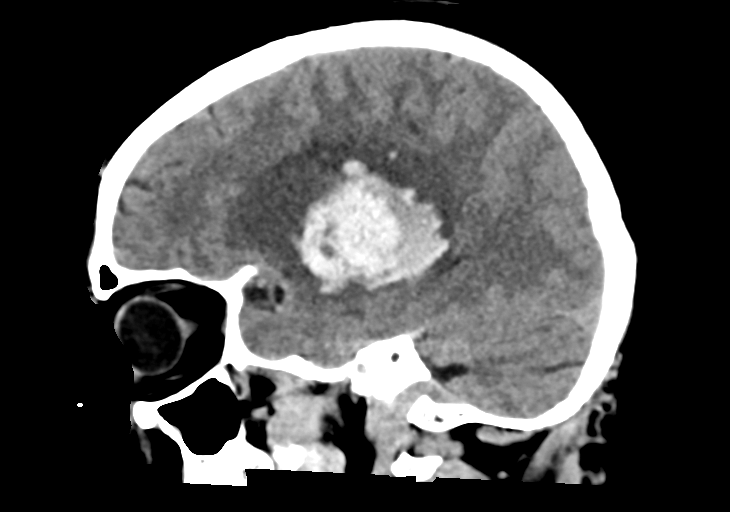
[im 30/59  brain]
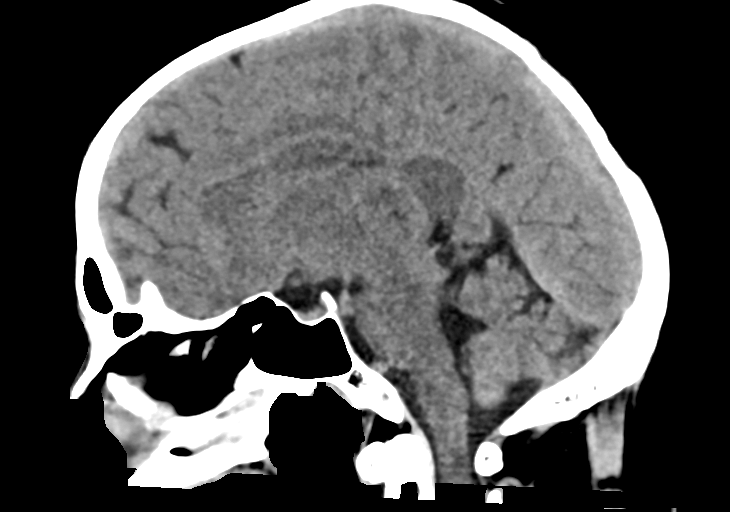
[im 39/59  brain]
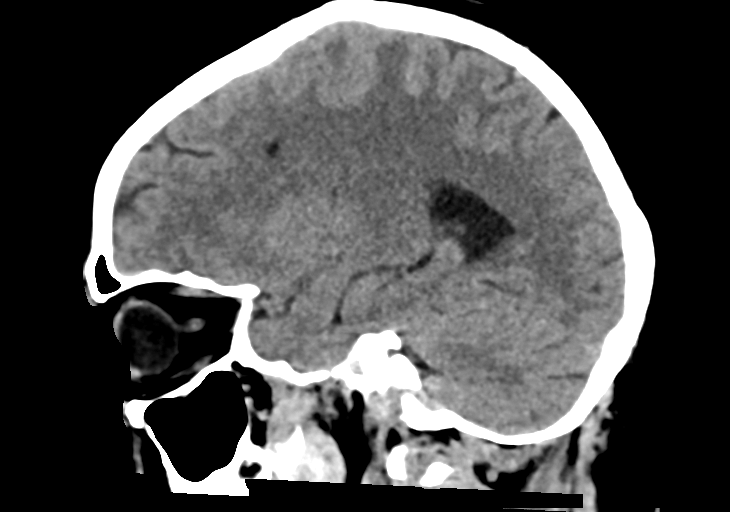

[13 of 47 positions shown; findings below may reference images not displayed]

FINDINGS: Brain:

Unchanged size of an acute/early subacute parenchymal hemorrhage
centered within the right basal ganglia and radiating white matter
tracks. As before, the hemorrhage measures 5.7 x 3.1 x 4.5 cm (AP x
TV x CC). Surrounding edema has slightly increased. As before, there
is significant partial effacement of the right lateral ventricle.
Unchanged 6 mm leftward midline shift. No intraventricular extension
of hemorrhage at this time.

A small acute ischemic infarct within the left periatrial white
matter was better appreciated on prior MRI 07/11/2018. Additional
patchy hypoattenuation within the cerebral white matter is
nonspecific, but consistent with chronic small vessel ischemic
disease.

No acute demarcated cortical infarct is identified.

No extra-axial fluid collection.

No evidence of intracranial mass.

Vascular: No hyperdense vessel.  Atherosclerotic calcifications.

Skull: Normal. Negative for fracture or focal lesion.

Sinuses/Orbits: Visualized orbits show no acute finding. Mild
ethmoid and right maxillary sinus mucosal thickening. No significant
mastoid effusion.
IMPRESSION: Unchanged size of 5.7 cm acute/early subacute parenchymal hemorrhage
centered within the right basal ganglia and radiating white matter
tracks. Surrounding edema has slightly increased. Mass effect has
not significantly changed with persistent partial effacement of the
right lateral ventricle and 6 mm leftward midline shift. No
intraventricular extension of hemorrhage at this time.

A small acute ischemic infarct within the left periatrial white
matter was better appreciated on prior MRI 07/10/2019.

Stable background chronic small vessel ischemic changes within the
cerebral white matter.

Mild paranasal sinus mucosal thickening.

## 2021-03-09 IMAGING — DX DG CHEST 1V PORT
1 series · 1 of 1 positions shown · non-contrast
Comparison: 07/10/2019

CLINICAL DATA: Stroke.

EXAM:
PORTABLE CHEST 1 VIEW

[chest ap]
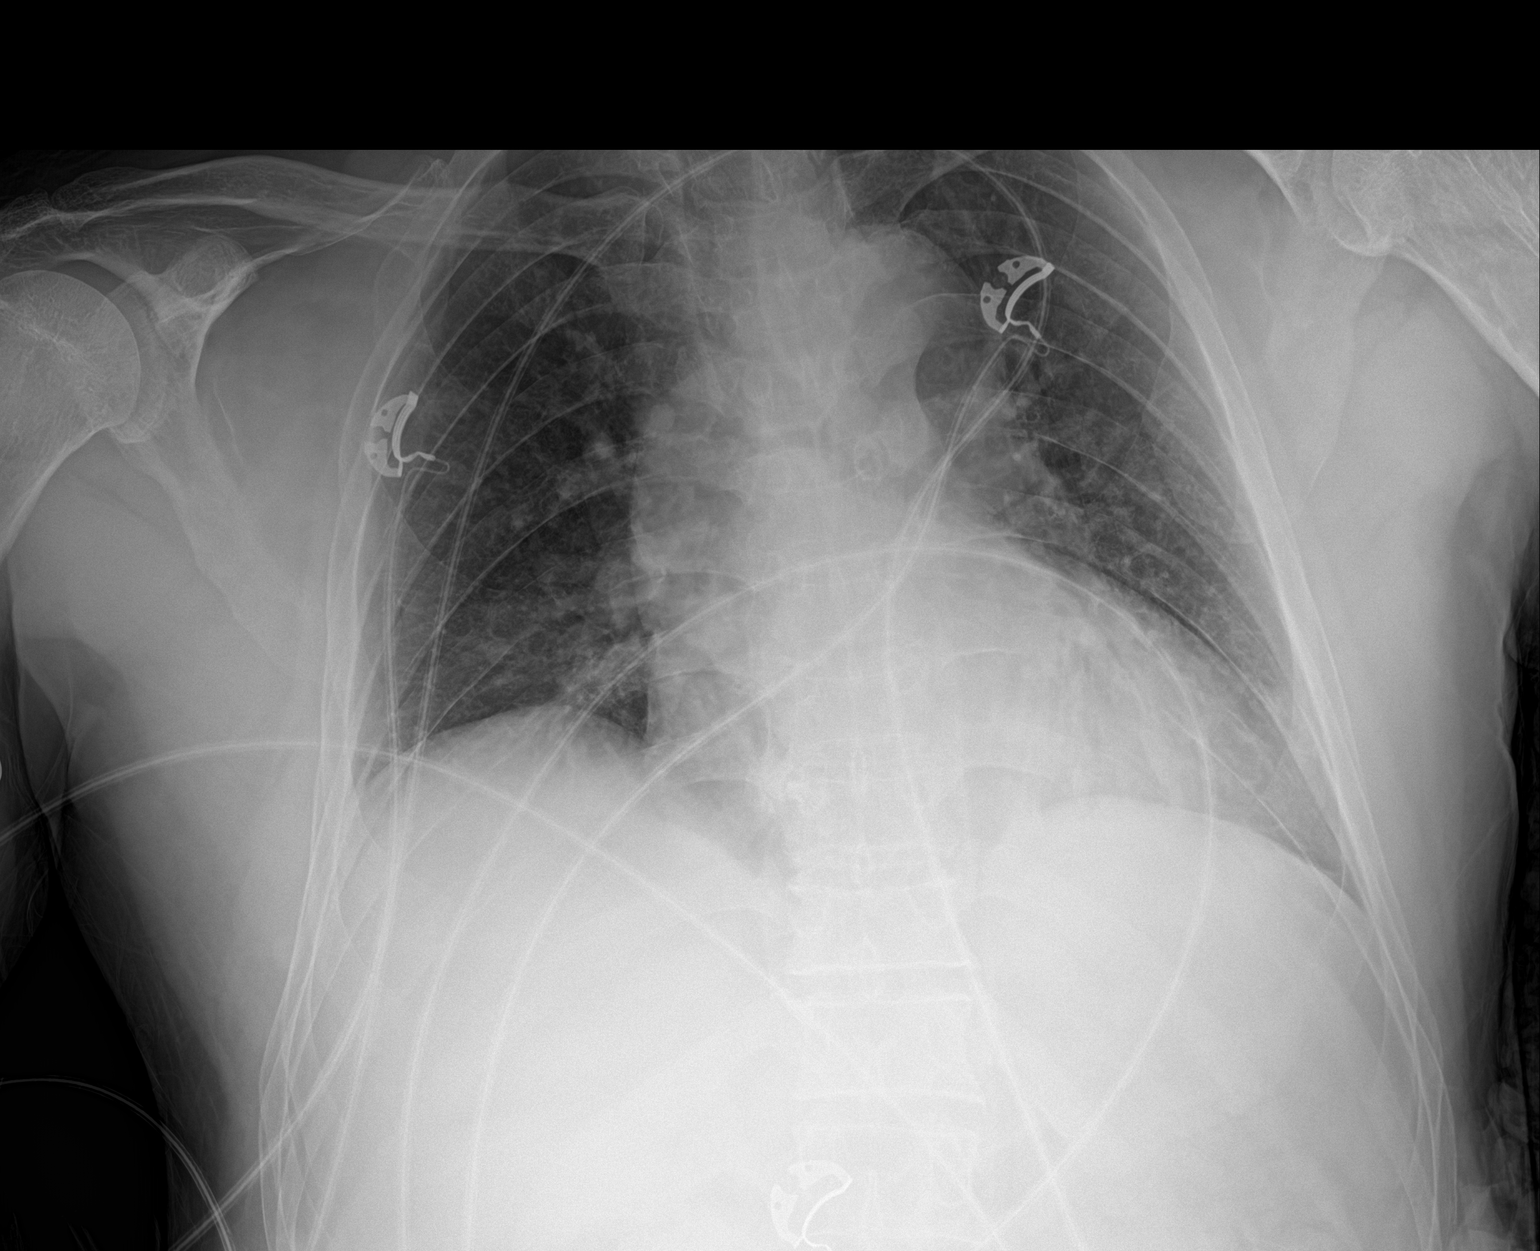

[1 of 1 positions shown; findings below may reference images not displayed]

FINDINGS: Again noted are low lung volumes with cardiomegaly. There is
vascular congestion with possible early interstitial edema. There is
no pneumothorax or large pleural effusion. There is some atelectasis
at the lung bases. There is no acute osseous abnormality.
IMPRESSION: Cardiomegaly with vascular congestion and possible early
interstitial edema.
